# Patient Record
Sex: Female | Born: 1940 | Race: White | Hispanic: No | Smoking: Never smoker
Health system: Southern US, Community
[De-identification: ages and names within clinical notes are randomized; demographics above are authoritative.]

## PROBLEM LIST (undated history)

## (undated) DIAGNOSIS — J40 Bronchitis, not specified as acute or chronic: Secondary | ICD-10-CM

## (undated) DIAGNOSIS — E785 Hyperlipidemia, unspecified: Secondary | ICD-10-CM

## (undated) DIAGNOSIS — J189 Pneumonia, unspecified organism: Secondary | ICD-10-CM

## (undated) DIAGNOSIS — M810 Age-related osteoporosis without current pathological fracture: Secondary | ICD-10-CM

## (undated) DIAGNOSIS — N63 Unspecified lump in unspecified breast: Secondary | ICD-10-CM

## (undated) DIAGNOSIS — F410 Panic disorder [episodic paroxysmal anxiety] without agoraphobia: Secondary | ICD-10-CM

## (undated) DIAGNOSIS — I10 Essential (primary) hypertension: Secondary | ICD-10-CM

## (undated) DIAGNOSIS — T8859XA Other complications of anesthesia, initial encounter: Secondary | ICD-10-CM

## (undated) DIAGNOSIS — M1990 Unspecified osteoarthritis, unspecified site: Secondary | ICD-10-CM

## (undated) HISTORY — PX: TONSILLECTOMY: SUR1361

## (undated) HISTORY — PX: COLONOSCOPY: SHX174

## (undated) HISTORY — PX: TUBAL LIGATION: SHX77

---

## 2009-10-16 ENCOUNTER — Ambulatory Visit: Payer: Self-pay | Admitting: Family Medicine

## 2009-10-23 ENCOUNTER — Ambulatory Visit: Payer: Self-pay | Admitting: Family Medicine

## 2010-10-21 ENCOUNTER — Ambulatory Visit: Payer: Self-pay | Admitting: Family Medicine

## 2010-10-27 ENCOUNTER — Ambulatory Visit: Payer: Self-pay | Admitting: Family Medicine

## 2011-10-28 ENCOUNTER — Ambulatory Visit: Payer: Self-pay | Admitting: Family Medicine

## 2012-02-17 DIAGNOSIS — N63 Unspecified lump in unspecified breast: Secondary | ICD-10-CM

## 2012-02-17 HISTORY — DX: Unspecified lump in unspecified breast: N63.0

## 2012-04-29 ENCOUNTER — Emergency Department: Payer: Self-pay | Admitting: Unknown Physician Specialty

## 2012-04-29 LAB — COMPREHENSIVE METABOLIC PANEL
Albumin: 3.5 g/dL (ref 3.4–5.0)
Alkaline Phosphatase: 114 U/L (ref 50–136)
Anion Gap: 4 — ABNORMAL LOW (ref 7–16)
BUN: 19 mg/dL — ABNORMAL HIGH (ref 7–18)
Bilirubin,Total: 0.7 mg/dL (ref 0.2–1.0)
Calcium, Total: 8.8 mg/dL (ref 8.5–10.1)
Chloride: 105 mmol/L (ref 98–107)
Co2: 29 mmol/L (ref 21–32)
Creatinine: 1.03 mg/dL (ref 0.60–1.30)
EGFR (African American): >60
EGFR (Non-African Amer.): 55 — ABNORMAL LOW
Glucose: 95 mg/dL (ref 65–99)
Osmolality: 278 (ref 275–301)
Potassium: 4.3 mmol/L (ref 3.5–5.1)
SGOT(AST): 34 U/L (ref 15–37)
SGPT (ALT): 41 U/L (ref 12–78)
Sodium: 138 mmol/L (ref 136–145)
Total Protein: 7 g/dL (ref 6.4–8.2)

## 2012-04-29 LAB — CBC
HCT: 39.9 % (ref 35.0–47.0)
HGB: 13.4 g/dL (ref 12.0–16.0)
MCH: 29.5 pg (ref 26.0–34.0)
MCHC: 33.5 g/dL (ref 32.0–36.0)
MCV: 88 fL (ref 80–100)
Platelet: 253 10*3/uL (ref 150–440)
RBC: 4.53 10*6/uL (ref 3.80–5.20)
RDW: 14.3 % (ref 11.5–14.5)
WBC: 8 10*3/uL (ref 3.6–11.0)

## 2012-04-29 LAB — TROPONIN I: Troponin-I: <0.02 ng/mL

## 2012-04-29 LAB — CK TOTAL AND CKMB (NOT AT ARMC)
CK, Total: 77 U/L (ref 21–215)
CK-MB: 1.1 ng/mL (ref 0.5–3.6)

## 2012-10-28 ENCOUNTER — Ambulatory Visit: Payer: Self-pay | Admitting: Family Medicine

## 2012-11-16 ENCOUNTER — Ambulatory Visit: Payer: Self-pay | Admitting: Family Medicine

## 2012-12-23 ENCOUNTER — Ambulatory Visit: Payer: Self-pay | Admitting: Gastroenterology

## 2012-12-30 ENCOUNTER — Ambulatory Visit: Payer: Self-pay | Admitting: Gastroenterology

## 2014-01-24 DIAGNOSIS — R519 Headache, unspecified: Secondary | ICD-10-CM | POA: Insufficient documentation

## 2014-01-24 DIAGNOSIS — R4701 Aphasia: Secondary | ICD-10-CM | POA: Insufficient documentation

## 2014-01-24 DIAGNOSIS — G44319 Acute post-traumatic headache, not intractable: Secondary | ICD-10-CM | POA: Insufficient documentation

## 2014-01-24 DIAGNOSIS — R42 Dizziness and giddiness: Secondary | ICD-10-CM | POA: Insufficient documentation

## 2014-01-24 DIAGNOSIS — M792 Neuralgia and neuritis, unspecified: Secondary | ICD-10-CM | POA: Insufficient documentation

## 2014-12-05 ENCOUNTER — Other Ambulatory Visit: Payer: Self-pay | Admitting: Family Medicine

## 2014-12-05 DIAGNOSIS — N63 Unspecified lump in unspecified breast: Secondary | ICD-10-CM

## 2015-01-03 ENCOUNTER — Ambulatory Visit: Payer: Self-pay

## 2015-01-03 ENCOUNTER — Other Ambulatory Visit: Payer: Self-pay

## 2015-01-04 ENCOUNTER — Ambulatory Visit
Admission: RE | Admit: 2015-01-04 | Discharge: 2015-01-04 | Disposition: A | Discharge status: Home / Self Care | Payer: Medicare HMO | Source: Ambulatory Visit | Attending: Family Medicine | Admitting: Family Medicine

## 2015-01-04 ENCOUNTER — Encounter (INDEPENDENT_AMBULATORY_CARE_PROVIDER_SITE_OTHER): Payer: Self-pay

## 2015-01-04 DIAGNOSIS — N63 Unspecified lump in unspecified breast: Secondary | ICD-10-CM

## 2015-01-04 HISTORY — DX: Unspecified lump in unspecified breast: N63.0

## 2016-12-11 ENCOUNTER — Other Ambulatory Visit: Payer: Self-pay | Admitting: Family Medicine

## 2016-12-11 DIAGNOSIS — Z1231 Encounter for screening mammogram for malignant neoplasm of breast: Secondary | ICD-10-CM

## 2016-12-24 ENCOUNTER — Ambulatory Visit
Admission: RE | Admit: 2016-12-24 | Discharge: 2016-12-24 | Disposition: A | Discharge status: Home / Self Care | Payer: Medicare HMO | Source: Ambulatory Visit | Attending: Family Medicine | Admitting: Family Medicine

## 2016-12-24 DIAGNOSIS — Z1231 Encounter for screening mammogram for malignant neoplasm of breast: Secondary | ICD-10-CM | POA: Insufficient documentation

## 2017-12-21 ENCOUNTER — Other Ambulatory Visit: Payer: Self-pay | Admitting: Family Medicine

## 2017-12-21 DIAGNOSIS — Z1231 Encounter for screening mammogram for malignant neoplasm of breast: Secondary | ICD-10-CM

## 2018-01-27 ENCOUNTER — Ambulatory Visit
Admission: RE | Admit: 2018-01-27 | Discharge: 2018-01-27 | Disposition: A | Discharge status: Home / Self Care | Payer: Medicare HMO | Source: Ambulatory Visit | Attending: Family Medicine | Admitting: Family Medicine

## 2018-01-27 DIAGNOSIS — Z1231 Encounter for screening mammogram for malignant neoplasm of breast: Secondary | ICD-10-CM | POA: Insufficient documentation

## 2018-02-16 HISTORY — PX: CARPAL TUNNEL RELEASE: SHX101

## 2019-05-03 ENCOUNTER — Other Ambulatory Visit: Payer: Self-pay | Admitting: Family Medicine

## 2019-05-03 DIAGNOSIS — Z1231 Encounter for screening mammogram for malignant neoplasm of breast: Secondary | ICD-10-CM

## 2019-06-08 ENCOUNTER — Ambulatory Visit
Admission: RE | Admit: 2019-06-08 | Discharge: 2019-06-08 | Disposition: A | Discharge status: Home / Self Care | Payer: Medicare HMO | Source: Ambulatory Visit | Attending: Family Medicine | Admitting: Family Medicine

## 2019-06-08 DIAGNOSIS — Z1231 Encounter for screening mammogram for malignant neoplasm of breast: Secondary | ICD-10-CM | POA: Diagnosis present

## 2020-08-09 ENCOUNTER — Other Ambulatory Visit: Payer: Self-pay | Admitting: Orthopedic Surgery

## 2020-08-09 ENCOUNTER — Other Ambulatory Visit (HOSPITAL_COMMUNITY): Payer: Self-pay | Admitting: Orthopedic Surgery

## 2020-08-09 DIAGNOSIS — M5416 Radiculopathy, lumbar region: Secondary | ICD-10-CM

## 2020-08-18 ENCOUNTER — Ambulatory Visit
Admission: RE | Admit: 2020-08-18 | Discharge: 2020-08-18 | Disposition: A | Discharge status: Home / Self Care | Payer: Medicare HMO | Source: Ambulatory Visit | Attending: Orthopedic Surgery | Admitting: Orthopedic Surgery

## 2020-08-18 ENCOUNTER — Other Ambulatory Visit: Payer: Self-pay

## 2020-08-18 DIAGNOSIS — M5416 Radiculopathy, lumbar region: Secondary | ICD-10-CM | POA: Insufficient documentation

## 2020-11-23 NOTE — Discharge Instructions (Signed)
Instructions after Total Hip Replacement     Ronney Honeywell P. Mellany Dinsmore, Jr., M.D.     Dept. of Orthopaedics & Sports Medicine  [facility]  [address]  [place], [place]  [address]  Phone: [phone]   Fax: [phone]    DIET: . Drink plenty of non-alcoholic fluids. . Resume your normal diet. Include foods high in fiber.  ACTIVITY:  . You may use crutches or a walker with weight-bearing as tolerated, unless instructed otherwise. . You may be weaned off of the walker or crutches by your Physical Therapist.  . Do NOT reach below the level of your knees or cross your legs until allowed.    . Continue doing gentle exercises. Exercising will reduce the pain and swelling, increase motion, and prevent muscle weakness.   . Please continue to use the TED compression stockings for 6 weeks. You may remove the stockings at night, but should reapply them in the morning. . Do not drive or operate any equipment until instructed.  WOUND CARE:  . Continue to use ice packs periodically to reduce pain and swelling. . Keep the incision clean and dry. . You may bathe or shower after the staples are removed at the first office visit following surgery.  MEDICATIONS: . You may resume your regular medications. . Please take the pain medication as prescribed on the medication. . Do not take pain medication on an empty stomach. . You have been given a prescription for a blood thinner to prevent blood clots. Please take the medication as instructed. (NOTE: After completing a 2 week course of Lovenox, take one Enteric-coated aspirin once a day.) . Pain medications and iron supplements can cause constipation. Use a stool softener (Senokot or Colace) on a daily basis and a laxative (dulcolax or miralax) as needed. . Do not drive or drink alcoholic beverages when taking pain medications.  CALL THE OFFICE FOR: . Temperature above 101 degrees . Excessive bleeding or drainage on the dressing. . Excessive  swelling, coldness, or paleness of the toes. . Persistent nausea and vomiting.  FOLLOW-UP:  . You should have an appointment to return to the office in 6 weeks after surgery. . Arrangements have been made for continuation of Physical Therapy (either home therapy or outpatient therapy).     [facility] Department Directory         [email or web address]       [email or web address]          Cardiology  Appointments: [place] - [phone] [place] - [phone]  Endocrinology  Appointments: [place] - [phone] [place] - [phone]  Gastroenterology  Appointments: [place] - [phone] [place] - [phone]        General Surgery   Appointments: [place] - [phone]  Internal Medicine/Family Medicine  Appointments: [place] - [phone] [place] - [phone] [place] - [phone]  Metabolic and Weigh Loss Surgery  Appointments: [place] - [phone]        Neurology  Appointments: [place] - [phone] [place] - [phone]  Neurosurgery  Appointments: [place] - [phone]  Obstetrics & Gynecology  Appointments: [place] - [phone] [place] - [phone]        Pediatrics  Appointments: [place] - [phone] [place] - [phone]  Physiatry  Appointments: [place] -[phone]  Physical Therapy  Appointments: [place] - [phone] [place] - [phone]        Podiatry  Appointments: [place] - [phone] [place] - [phone]  Pulmonology  Appointments: [place] - [phone]  Rheumatology  Appointments: [place] - [phone]         Location: [facility]  [address] [place], [place]  [address]  [place] Location: [facility] [address] [place], [place]  [address]  [place] Location: [facility] [address] [place], [place]  [address]    

## 2020-12-02 ENCOUNTER — Other Ambulatory Visit
Admission: RE | Admit: 2020-12-02 | Discharge: 2020-12-02 | Disposition: A | Discharge status: Home / Self Care | Payer: Medicare HMO | Source: Ambulatory Visit | Attending: Orthopedic Surgery | Admitting: Orthopedic Surgery

## 2020-12-02 ENCOUNTER — Other Ambulatory Visit: Payer: Self-pay

## 2020-12-02 DIAGNOSIS — M1611 Unilateral primary osteoarthritis, right hip: Secondary | ICD-10-CM | POA: Insufficient documentation

## 2020-12-02 DIAGNOSIS — Z01818 Encounter for other preprocedural examination: Secondary | ICD-10-CM | POA: Diagnosis not present

## 2020-12-02 DIAGNOSIS — I10 Essential (primary) hypertension: Secondary | ICD-10-CM | POA: Insufficient documentation

## 2020-12-02 HISTORY — DX: Pneumonia, unspecified organism: J18.9

## 2020-12-02 HISTORY — DX: Essential (primary) hypertension: I10

## 2020-12-02 HISTORY — DX: Hyperlipidemia, unspecified: E78.5

## 2020-12-02 HISTORY — DX: Other complications of anesthesia, initial encounter: T88.59XA

## 2020-12-02 HISTORY — DX: Panic disorder (episodic paroxysmal anxiety): F41.0

## 2020-12-02 HISTORY — DX: Age-related osteoporosis without current pathological fracture: M81.0

## 2020-12-02 HISTORY — DX: Bronchitis, not specified as acute or chronic: J40

## 2020-12-02 HISTORY — DX: Unspecified osteoarthritis, unspecified site: M19.90

## 2020-12-02 LAB — COMPREHENSIVE METABOLIC PANEL
ALT: 29 U/L (ref 0–44)
AST: 28 U/L (ref 15–41)
Albumin: 3.8 g/dL (ref 3.5–5.0)
Alkaline Phosphatase: 106 U/L (ref 38–126)
Anion gap: 10 (ref 5–15)
BUN: 20 mg/dL (ref 8–23)
CO2: 31 mmol/L (ref 22–32)
Calcium: 9.6 mg/dL (ref 8.9–10.3)
Chloride: 102 mmol/L (ref 98–111)
Creatinine, Ser: 1.01 mg/dL — ABNORMAL HIGH (ref 0.44–1.00)
GFR, Estimated: 56 mL/min — ABNORMAL LOW (ref >60)
Glucose, Bld: 117 mg/dL — ABNORMAL HIGH (ref 70–99)
Potassium: 4.2 mmol/L (ref 3.5–5.1)
Sodium: 143 mmol/L (ref 135–145)
Total Bilirubin: 0.8 mg/dL (ref 0.3–1.2)
Total Protein: 7 g/dL (ref 6.5–8.1)

## 2020-12-02 LAB — PROTIME-INR
INR: 1 (ref 0.8–1.2)
Prothrombin Time: 13.1 seconds (ref 11.4–15.2)

## 2020-12-02 LAB — URINALYSIS, ROUTINE W REFLEX MICROSCOPIC
Bilirubin Urine: NEGATIVE
Glucose, UA: NEGATIVE mg/dL
Hgb urine dipstick: NEGATIVE
Ketones, ur: NEGATIVE mg/dL
Nitrite: NEGATIVE
Protein, ur: 30 mg/dL — AB
Specific Gravity, Urine: 1.023 (ref 1.005–1.030)
pH: 5 (ref 5.0–8.0)

## 2020-12-02 LAB — TYPE AND SCREEN
ABO/RH(D): B POS
Antibody Screen: NEGATIVE

## 2020-12-02 LAB — CBC
HCT: 38.5 % (ref 36.0–46.0)
Hemoglobin: 12.7 g/dL (ref 12.0–15.0)
MCH: 30.6 pg (ref 26.0–34.0)
MCHC: 33 g/dL (ref 30.0–36.0)
MCV: 92.8 fL (ref 80.0–100.0)
Platelets: 299 10*3/uL (ref 150–400)
RBC: 4.15 MIL/uL (ref 3.87–5.11)
RDW: 13.5 % (ref 11.5–15.5)
WBC: 5.5 10*3/uL (ref 4.0–10.5)
nRBC: 0 % (ref 0.0–0.2)

## 2020-12-02 LAB — SURGICAL PCR SCREEN
MRSA, PCR: NEGATIVE
Staphylococcus aureus: POSITIVE — AB

## 2020-12-02 LAB — C-REACTIVE PROTEIN: CRP: 0.9 mg/dL (ref <1.0)

## 2020-12-02 LAB — APTT: aPTT: 27 seconds (ref 24–36)

## 2020-12-02 LAB — SEDIMENTATION RATE: Sed Rate: 34 mm/hr — ABNORMAL HIGH (ref 0–30)

## 2020-12-02 NOTE — Patient Instructions (Addendum)
Your procedure is scheduled on:12-09-20 Monday Report to the Registration Desk on the 1st floor of the Medical Mall.Then proceed to the 2nd floor Surgery Desk in the Medical Mall To find out your arrival time, please call [phone] between 1PM - 3PM on:12-06-20 Friday  REMEMBER: Instructions that are not followed completely may result in serious medical risk, up to and including death; or upon the discretion of your surgeon and anesthesiologist your surgery may need to be rescheduled.  Do not eat food after midnight the night before surgery.  No gum chewing, lozengers or hard candies.  You may however, drink CLEAR liquids up to 2 hours before you are scheduled to arrive for your surgery. Do not drink anything within 2 hours of your scheduled arrival time.  Clear liquids include: - water  - apple juice without pulp - gatorade (not RED, PURPLE, OR BLUE) - black coffee or tea (Do NOT add milk or creamers to the coffee or tea) Do NOT drink anything that is not on this list.  In addition, your doctor has ordered for you to drink the provided  Ensure Pre-Surgery Clear Carbohydrate Drink  Drinking this carbohydrate drink up to two hours before surgery helps to reduce insulin resistance and improve patient outcomes. Please complete drinking 2 hours prior to scheduled arrival time.  TAKE THESE MEDICATIONS THE MORNING OF SURGERY WITH A SIP OF WATER: -amLODipine (NORVASC) 5 MG tablet  Stop your 81 mg Aspirin NOW (12-02-20)   One week prior to surgery: Stop Anti-inflammatories (NSAIDS) such as meloxicam (MOBIC) 15 MG tablet, Advil, Aleve, Ibuprofen, Motrin, Naproxen, Naprosyn and Aspirin based products such as Excedrin, Goodys Powder, BC Powder.You may however, continue to take Tylenol if needed for pain up until the day of surgery.  Stop ANY OVER THE COUNTER supplements/vitamins NOW (12-02-20) until after surgery (Turmeric)  No Alcohol for 24 hours before or after surgery.  No Smoking  including e-cigarettes for 24 hours prior to surgery.  No chewable tobacco products for at least 6 hours prior to surgery.  No nicotine patches on the day of surgery.  Do not use any "recreational" drugs for at least a week prior to your surgery.  Please be advised that the combination of cocaine and anesthesia may have negative outcomes, up to and including death. If you test positive for cocaine, your surgery will be cancelled.  On the morning of surgery brush your teeth with toothpaste and water, you may rinse your mouth with mouthwash if you wish. Do not swallow any toothpaste or mouthwash.  Use CHG Soap as directed on instruction sheet.  Do not wear jewelry, make-up, hairpins, clips or nail polish.  Do not wear lotions, powders, or perfumes.   Do not shave body from the neck down 48 hours prior to surgery just in case you cut yourself which could leave a site for infection.  Also, freshly shaved skin may become irritated if using the CHG soap.  Contact lenses, hearing aids and dentures may not be worn into surgery.  Do not bring valuables to the hospital. [facility] is not responsible for any missing/lost belongings or valuables.   Notify your doctor if there is any change in your medical condition (cold, fever, infection).  Wear comfortable clothing (specific to your surgery type) to the hospital.  After surgery, you can help prevent lung complications by doing breathing exercises.  Take deep breaths and cough every 1-2 hours. Your doctor may order a device called an Incentive Spirometer to help  you take deep breaths. When coughing or sneezing, hold a pillow firmly against your incision with both hands. This is called "splinting." Doing this helps protect your incision. It also decreases belly discomfort.  If you are being admitted to the hospital overnight, leave your suitcase in the car. After surgery it may be brought to your room.  If you are being discharged the day of  surgery, you will not be allowed to drive home. You will need a responsible adult (18 years or older) to drive you home and stay with you that night.   If you are taking public transportation, you will need to have a responsible adult (18 years or older) with you. Please confirm with your physician that it is acceptable to use public transportation.   Please call the Pre-admissions Testing Dept. at [phone] if you have any questions about these instructions.  Surgery Visitation Policy:  Patients undergoing a surgery or procedure may have one family member or support person with them as long as that person is not COVID-19 positive or experiencing its symptoms.  That person may remain in the waiting area during the procedure and may rotate out with other people.  Inpatient Visitation:    Visiting hours are 7 a.m. to 8 p.m. Up to two visitors ages 16+ are allowed at one time in a patient room. The visitors may rotate out with other people during the day. Visitors must check out when they leave, or other visitors will not be allowed. One designated support person may remain overnight. The visitor must pass COVID-19 screenings, use hand sanitizer when entering and exiting the patient's room and wear a mask at all times, including in the patient's room. Patients must also wear a mask when staff or their visitor are in the room. Masking is required regardless of vaccination status.

## 2020-12-03 LAB — URINE CULTURE

## 2020-12-05 ENCOUNTER — Other Ambulatory Visit
Admission: RE | Admit: 2020-12-05 | Discharge: 2020-12-05 | Disposition: A | Discharge status: Home / Self Care | Payer: Medicare HMO | Source: Ambulatory Visit | Attending: Orthopedic Surgery | Admitting: Orthopedic Surgery

## 2020-12-05 ENCOUNTER — Other Ambulatory Visit: Payer: Self-pay

## 2020-12-05 DIAGNOSIS — Z01812 Encounter for preprocedural laboratory examination: Secondary | ICD-10-CM | POA: Diagnosis present

## 2020-12-05 DIAGNOSIS — Z20822 Contact with and (suspected) exposure to covid-19: Secondary | ICD-10-CM | POA: Insufficient documentation

## 2020-12-05 LAB — SARS CORONAVIRUS 2 (TAT 6-24 HRS): SARS Coronavirus 2: NEGATIVE

## 2020-12-07 NOTE — Anesthesia Preprocedure Evaluation (Addendum)
Anesthesia Evaluation  Patient identified by MRN, date of birth, ID band Patient awake    Reviewed: Allergy & Precautions, NPO status , Patient's Chart, lab work & pertinent test results  Airway Mallampati: III  TM Distance: >3 FB Neck ROM: full    Dental  (+) Edentulous Upper, Edentulous Lower   Pulmonary shortness of breath and with exertion,    Pulmonary exam normal        Cardiovascular Exercise Tolerance: Poor METS: 3 - Mets hypertension, Pt. on medications Normal cardiovascular exam     Neuro/Psych PSYCHIATRIC DISORDERS Anxiety negative neurological ROS     GI/Hepatic negative GI ROS, Neg liver ROS,   Endo/Other  negative endocrine ROS  Renal/GU      Musculoskeletal  (+) Arthritis ,   Abdominal (+) + obese,   Peds  Hematology negative hematology ROS (+)   Anesthesia Other Findings Past Medical History: No date: Arthritis 2014: Breast mass     Comment:  report requested 6 mo f/u cysts rt, pt did not return No date: Bronchitis     Comment:  chronic No date: Complication of anesthesia     Comment:  hard to wake up No date: Hyperlipidemia No date: Hypertension No date: Osteoporosis No date: Panic attacks No date: Pneumonia     Comment:  h/o  Past Surgical History: 2020: CARPAL TUNNEL RELEASE; Right No date: COLONOSCOPY No date: TONSILLECTOMY No date: TUBAL LIGATION     Reproductive/Obstetrics negative OB ROS                            Anesthesia Physical Anesthesia Plan  ASA: 2  Anesthesia Plan: Spinal   Post-op Pain Management:    Induction:   PONV Risk Score and Plan:   Airway Management Planned: Natural Airway and Simple Face Mask  Additional Equipment:   Intra-op Plan:   Post-operative Plan:   Informed Consent: I have reviewed the patients History and Physical, chart, labs and discussed the procedure including the risks, benefits and alternatives for  the proposed anesthesia with the patient or authorized representative who has indicated his/her understanding and acceptance.     Dental advisory given  Plan Discussed with: CRNA, Anesthesiologist and Surgeon  Anesthesia Plan Comments: (Discussed back-up General anesthesia)       Anesthesia Quick Evaluation

## 2020-12-08 NOTE — H&P (Signed)
ORTHOPAEDIC HISTORY & PHYSICAL [name], [name] - 12/03/2020 1:15 PM EDT Formatting of this note is different from the original. [facility] Chief Complaint:   Chief Complaint  Patient presents with   Hip Pain  H & P RIGHT HIP   History of Present Illness:   Catherine Hale is a 80 y.o. female that presents to clinic today for her preoperative history and evaluation. Patient presents unaccompanied. The patient is scheduled to undergo a right total knee arthroplasty on 12/09/20 by Dr. Ernest Pine. Her pain began over 1 year ago. The pain is located primarily in the right hip and thigh. She describes her pain as worse with weightbearing. She reports associated getting in and out of the car, putting on her shoes. She denies associated numbness or tingling.   The patient's symptoms have progressed to the point that they decrease her quality of life. The patient has previously undergone conservative treatment including NSAIDS and injections to the knee without adequate control of her symptoms.  Denies significant cardiac history, denies history of lumbar surgery, or history of blood clots. Her daughter will be staying with her after surgery.   Patient does ask about preparing forms for her to have a handicap sticker.   Past Medical, Surgical, Family, Social History, Allergies, Medications:   Past Medical History:  Past Medical History:  Diagnosis Date   Bursitis   Chickenpox   Depression   Hyperlipidemia   Hypertension   Osteoporosis, post-menopausal  but doesn't remember her last bone density, she has actually been on medications for this in the past also.   Panic attacks  has been on Paxil in the past.   Pneumonia  back in the 70's   Past Surgical History:  Past Surgical History:  Procedure Laterality Date   COLONOSCOPY 12/2012   Right carpal tunnel release Right 03/01/2019  Dr. Rosita Kea   TONSILLECTOMY 1970   TUBAL LIGATION  Bilateral 1976   Current Medications:  Current Outpatient Medications  Medication Sig Dispense Refill   acetaminophen (TYLENOL) 500 MG tablet Take 500 mg by mouth as needed for Pain   amLODIPine (NORVASC) 5 MG tablet TAKE 1 TABLET(5 MG) BY MOUTH EVERY DAY 90 tablet 3   aspirin 81 MG EC tablet Take 81 mg by mouth once daily.   fluticasone propionate (FLONASE) 50 mcg/actuation nasal spray SHAKE LIQUID AND USE 2 SPRAYS IN EACH NOSTRIL EVERY DAY 48 g 3   meloxicam (MOBIC) 15 MG tablet Take 1 tablet (15 mg total) by mouth once daily 90 tablet 3   TURMERIC ORAL Take 1 capsule by mouth once daily   benzonatate (TESSALON) 200 MG capsule Take 1 capsule (200 mg total) by mouth every 8 (eight) hours as needed for Cough 30 capsule 0   green tea leaf extract Cap Take 1 capsule by mouth once daily   No current facility-administered medications for this visit.   Allergies:  Allergies  Allergen Reactions   Lactose Diarrhea   Lisinopril Cough   Social History:  Social History   Socioeconomic History   Marital status: Widowed   Number of children: 2   Years of education: 15  Occupational History   Occupation: Part-time- Audiological scientist  Tobacco Use   Smoking status: Never Smoker   Smokeless tobacco: Never Used  Building services engineer Use: Never used  Substance and Sexual Activity   Alcohol use: No  Alcohol/week: 0.0 standard drinks   Drug use: No   Sexual  activity: Defer  Partners: Male   Family History:  Family History  Problem Relation Age of Onset   Colon cancer Mother   Myocardial Infarction (Heart attack) Sister 68   Review of Systems:   A 10+ ROS was performed, reviewed, and the pertinent orthopaedic findings are documented in the HPI.   Physical Examination:   BP 126/80 (BP Location: Left upper arm, Patient Position: Sitting, BP Cuff Size: Large Adult)  Ht 162.6 cm (5\' 4" )  Wt 90.2 kg (198 lb 12.8 oz)  LMP (LMP Unknown)  BMI 34.12 kg/m   Patient is a well-developed,  well-nourished female in no acute distress. Patient has normal mood and affect. Patient is alert and oriented to person, place, and time.   HEENT: Atraumatic, normocephalic. Pupils equal and reactive to light. Extraocular motion intact. Noninjected sclera.  Cardiovascular: Regular rate and rhythm, with no murmurs, rubs, or gallops. Distal pulses palpable. No carotid bruits.  Respiratory: Lungs clear to auscultation bilaterally.   Right Hip: Pelvic tilt: Negative Limb lengths: Equal with the patient standing Soft tissue swelling: Negative Erythema: Negative Crepitance: Negative Tenderness: Greater trochanter is nontender to palpation. Moderate pain is elicited by axial compression or extremes of rotation. Atrophy: No atrophy. Fair to good hip flexor and abductor strength. Range of Motion: EXT/FLEX: 0/0/90 ADD/ABD: 10/0/10 IR/ER: 10/0/10  Sensation intact over the saphenous, lateral sural cutaneous, superficial fibular, and deep fibular nerve distributions.  Tests Performed/Reviewed:  X-rays  Anteroposterior view of the pelvis as well as anteroposterior and lateral views of the right hip were obtained. Images reveal significant loss of femoral acetabular joint space with osteophyte formation. No fractures or dislocations.  Personally ordered and interpreted today's x-rays.  Impression:   ICD-10-CM  1. Primary osteoarthritis of right hip M16.11   Plan:   The patient has end-stage degenerative changes of the right knee. It was explained to the patient that the condition is progressive in nature. Having failed conservative treatment, the patient has elected to proceed with a total joint arthroplasty. The patient will undergo a total joint arthroplasty with Dr. 12-04-2004. The risks of surgery, including blood clot and infection, were discussed with the patient. Measures to reduce these risks, including the use of anticoagulation, perioperative antibiotics, and early ambulation were  discussed. The importance of postoperative physical therapy was discussed with the patient. The patient elects to proceed with surgery. The patient is instructed to stop all blood thinners prior to surgery. The patient is instructed to call the hospital the day before surgery to learn of the proper arrival time.   Contact our office with any questions or concerns. Follow up as indicated, or sooner should any new problems arise, if conditions worsen, or if they are otherwise concerned.   Ernest Pine, PA -C [facility]opa[facility] and [facility] Medicine [address] [place], [place] Phone: [phone]  This note was generated in part with voice recognition software and I apologize for any typographical errors that were not detected and corrected.  Electronically signed by [phone], PA at 12/06/2020 7:47 PM EDT

## 2020-12-09 ENCOUNTER — Encounter
Admission: RE | Disposition: A | Discharge status: Home / Self Care | Payer: Self-pay | Source: Home / Self Care | Attending: Orthopedic Surgery

## 2020-12-09 ENCOUNTER — Ambulatory Visit: Payer: Medicare HMO | Admitting: Anesthesiology

## 2020-12-09 ENCOUNTER — Encounter: Payer: Self-pay | Admitting: Orthopedic Surgery

## 2020-12-09 ENCOUNTER — Observation Stay
Admission: RE | Admit: 2020-12-09 | Discharge: 2020-12-10 | Disposition: A | Discharge status: Home / Self Care | Payer: Medicare HMO | Attending: Orthopedic Surgery | Admitting: Orthopedic Surgery

## 2020-12-09 ENCOUNTER — Ambulatory Visit: Payer: Medicare HMO | Admitting: Urgent Care

## 2020-12-09 ENCOUNTER — Other Ambulatory Visit: Payer: Self-pay

## 2020-12-09 ENCOUNTER — Observation Stay: Payer: Medicare HMO

## 2020-12-09 DIAGNOSIS — M1611 Unilateral primary osteoarthritis, right hip: Secondary | ICD-10-CM | POA: Diagnosis not present

## 2020-12-09 DIAGNOSIS — F32A Depression, unspecified: Secondary | ICD-10-CM | POA: Insufficient documentation

## 2020-12-09 DIAGNOSIS — Z79899 Other long term (current) drug therapy: Secondary | ICD-10-CM | POA: Diagnosis not present

## 2020-12-09 DIAGNOSIS — Z96641 Presence of right artificial hip joint: Secondary | ICD-10-CM

## 2020-12-09 DIAGNOSIS — I10 Essential (primary) hypertension: Secondary | ICD-10-CM | POA: Diagnosis not present

## 2020-12-09 DIAGNOSIS — M810 Age-related osteoporosis without current pathological fracture: Secondary | ICD-10-CM | POA: Insufficient documentation

## 2020-12-09 DIAGNOSIS — Z7982 Long term (current) use of aspirin: Secondary | ICD-10-CM | POA: Diagnosis not present

## 2020-12-09 DIAGNOSIS — J189 Pneumonia, unspecified organism: Secondary | ICD-10-CM | POA: Insufficient documentation

## 2020-12-09 DIAGNOSIS — F410 Panic disorder [episodic paroxysmal anxiety] without agoraphobia: Secondary | ICD-10-CM | POA: Insufficient documentation

## 2020-12-09 DIAGNOSIS — E785 Hyperlipidemia, unspecified: Secondary | ICD-10-CM | POA: Insufficient documentation

## 2020-12-09 DIAGNOSIS — Z96649 Presence of unspecified artificial hip joint: Secondary | ICD-10-CM

## 2020-12-09 DIAGNOSIS — M719 Bursopathy, unspecified: Secondary | ICD-10-CM | POA: Insufficient documentation

## 2020-12-09 HISTORY — PX: TOTAL HIP ARTHROPLASTY: SHX124

## 2020-12-09 LAB — ABO/RH: ABO/RH(D): B POS

## 2020-12-09 SURGERY — ARTHROPLASTY, HIP, TOTAL,POSTERIOR APPROACH
Anesthesia: Spinal | Site: Hip | Laterality: Right

## 2020-12-09 MED ORDER — FLEET ENEMA 7-19 GM/118ML RE ENEM
1.0000 | ENEMA | Freq: Once | RECTAL | Status: DC | PRN
Start: 2020-12-09 — End: 2020-12-10

## 2020-12-09 MED ORDER — CELECOXIB 200 MG PO CAPS
ORAL_CAPSULE | ORAL | Status: AC
  Administered 2020-12-09: 400 mg via ORAL
  Filled 2020-12-09: qty 2

## 2020-12-09 MED ORDER — ONDANSETRON HCL 4 MG PO TABS
4.0000 mg | ORAL_TABLET | Freq: Four times a day (QID) | ORAL | Status: DC | PRN
  Administered 2020-12-10: 4 mg via ORAL
  Filled 2020-12-09: qty 1

## 2020-12-09 MED ORDER — METAXALONE 800 MG PO TABS
800.0000 mg | ORAL_TABLET | Freq: Once | ORAL | Status: DC | PRN
  Filled 2020-12-09: qty 1

## 2020-12-09 MED ORDER — FAMOTIDINE 20 MG PO TABS
ORAL_TABLET | ORAL | Status: AC
  Administered 2020-12-09: 20 mg via ORAL
  Filled 2020-12-09: qty 1

## 2020-12-09 MED ORDER — DEXAMETHASONE SODIUM PHOSPHATE 10 MG/ML IJ SOLN
INTRAMUSCULAR | Status: AC
  Administered 2020-12-09: 8 mg via INTRAVENOUS
  Filled 2020-12-09: qty 1

## 2020-12-09 MED ORDER — AMLODIPINE BESYLATE 5 MG PO TABS
5.0000 mg | ORAL_TABLET | ORAL | Status: DC
  Administered 2020-12-10: 5 mg via ORAL
  Filled 2020-12-09: qty 1

## 2020-12-09 MED ORDER — PROPOFOL 10 MG/ML IV BOLUS
INTRAVENOUS | Status: DC | PRN
  Administered 2020-12-09: 20 mg via INTRAVENOUS

## 2020-12-09 MED ORDER — GABAPENTIN 300 MG PO CAPS: 300.0000 mg | ORAL_CAPSULE | Freq: Once | ORAL | Status: AC

## 2020-12-09 MED ORDER — CELECOXIB 200 MG PO CAPS: 400.0000 mg | ORAL_CAPSULE | Freq: Once | ORAL | Status: AC

## 2020-12-09 MED ORDER — TRANEXAMIC ACID-NACL 1000-0.7 MG/100ML-% IV SOLN: 1000.0000 mg | Freq: Once | INTRAVENOUS | Status: AC

## 2020-12-09 MED ORDER — TRANEXAMIC ACID-NACL 1000-0.7 MG/100ML-% IV SOLN
1000.0000 mg | INTRAVENOUS | Status: AC
  Administered 2020-12-09: 1000 mg via INTRAVENOUS

## 2020-12-09 MED ORDER — GABAPENTIN 300 MG PO CAPS
ORAL_CAPSULE | ORAL | Status: AC
  Administered 2020-12-09: 300 mg via ORAL
  Filled 2020-12-09: qty 1

## 2020-12-09 MED ORDER — OXYCODONE HCL 5 MG PO TABS
5.0000 mg | ORAL_TABLET | Freq: Once | ORAL | Status: DC | PRN
Start: 2020-12-09 — End: 2020-12-09

## 2020-12-09 MED ORDER — SODIUM CHLORIDE 0.9 % IV SOLN: INTRAVENOUS | Status: DC

## 2020-12-09 MED ORDER — PHENYLEPHRINE HCL-NACL 20-0.9 MG/250ML-% IV SOLN
INTRAVENOUS | Status: DC | PRN
  Administered 2020-12-09: 25 ug/min via INTRAVENOUS

## 2020-12-09 MED ORDER — ACETAMINOPHEN 10 MG/ML IV SOLN
INTRAVENOUS | Status: DC | PRN
  Administered 2020-12-09: 1000 mg via INTRAVENOUS

## 2020-12-09 MED ORDER — PHENOL 1.4 % MT LIQD
1.0000 | OROMUCOSAL | Status: DC | PRN
  Filled 2020-12-09: qty 177

## 2020-12-09 MED ORDER — CHLORHEXIDINE GLUCONATE 4 % EX LIQD: 60.0000 mL | Freq: Once | CUTANEOUS | Status: DC

## 2020-12-09 MED ORDER — FAMOTIDINE 20 MG PO TABS: 20.0000 mg | ORAL_TABLET | Freq: Once | ORAL | Status: AC

## 2020-12-09 MED ORDER — CEFAZOLIN SODIUM-DEXTROSE 2-4 GM/100ML-% IV SOLN
2.0000 g | Freq: Four times a day (QID) | INTRAVENOUS | Status: AC
  Administered 2020-12-09: 2 g via INTRAVENOUS
  Filled 2020-12-09 (×2): qty 100

## 2020-12-09 MED ORDER — FERROUS SULFATE 325 (65 FE) MG PO TABS
325.0000 mg | ORAL_TABLET | Freq: Two times a day (BID) | ORAL | Status: DC
  Administered 2020-12-09 – 2020-12-10 (×3): 325 mg via ORAL
  Filled 2020-12-09 (×3): qty 1

## 2020-12-09 MED ORDER — CHLORHEXIDINE GLUCONATE 0.12 % MT SOLN
OROMUCOSAL | Status: AC
  Administered 2020-12-09: 15 mL via OROMUCOSAL
  Filled 2020-12-09: qty 15

## 2020-12-09 MED ORDER — SURGIRINSE WOUND IRRIGATION SYSTEM - OPTIME
TOPICAL | Status: DC | PRN
  Administered 2020-12-09: 450 mL via TOPICAL

## 2020-12-09 MED ORDER — BISACODYL 10 MG RE SUPP
10.0000 mg | Freq: Every day | RECTAL | Status: DC | PRN
  Filled 2020-12-09: qty 1

## 2020-12-09 MED ORDER — DEXAMETHASONE SODIUM PHOSPHATE 10 MG/ML IJ SOLN: 8.0000 mg | Freq: Once | INTRAMUSCULAR | Status: AC

## 2020-12-09 MED ORDER — ACETAMINOPHEN 10 MG/ML IV SOLN: 1000.0000 mg | Freq: Once | INTRAVENOUS | Status: DC | PRN

## 2020-12-09 MED ORDER — ONDANSETRON HCL 4 MG/2ML IJ SOLN
4.0000 mg | Freq: Four times a day (QID) | INTRAMUSCULAR | Status: DC | PRN
  Administered 2020-12-09: 4 mg via INTRAVENOUS
  Filled 2020-12-09: qty 2

## 2020-12-09 MED ORDER — CEFAZOLIN SODIUM-DEXTROSE 2-4 GM/100ML-% IV SOLN
2.0000 g | INTRAVENOUS | Status: AC
  Administered 2020-12-09: 2 g via INTRAVENOUS

## 2020-12-09 MED ORDER — MIDAZOLAM HCL 5 MG/5ML IJ SOLN
INTRAMUSCULAR | Status: DC | PRN
  Administered 2020-12-09: .5 mg via INTRAVENOUS
  Administered 2020-12-09: 1 mg via INTRAVENOUS
  Administered 2020-12-09: .5 mg via INTRAVENOUS

## 2020-12-09 MED ORDER — FENTANYL CITRATE (PF) 100 MCG/2ML IJ SOLN: 25.0000 ug | INTRAMUSCULAR | Status: DC | PRN

## 2020-12-09 MED ORDER — PROPOFOL 500 MG/50ML IV EMUL
INTRAVENOUS | Status: DC | PRN
  Administered 2020-12-09: 60 ug/kg/min via INTRAVENOUS

## 2020-12-09 MED ORDER — PHENYLEPHRINE HCL (PRESSORS) 10 MG/ML IV SOLN
INTRAVENOUS | Status: AC
  Filled 2020-12-09: qty 1

## 2020-12-09 MED ORDER — NEOMYCIN-POLYMYXIN B GU 40-200000 IR SOLN
Status: DC | PRN
  Administered 2020-12-09: 12 mL

## 2020-12-09 MED ORDER — SODIUM CHLORIDE 0.9 % IR SOLN
Status: DC | PRN
  Administered 2020-12-09: 3000 mL

## 2020-12-09 MED ORDER — BUPIVACAINE HCL (PF) 0.5 % IJ SOLN
INTRAMUSCULAR | Status: DC | PRN
  Administered 2020-12-09: 3 mL

## 2020-12-09 MED ORDER — OXYCODONE HCL 5 MG/5ML PO SOLN: 5.0000 mg | Freq: Once | ORAL | Status: DC | PRN

## 2020-12-09 MED ORDER — MAGNESIUM HYDROXIDE 400 MG/5ML PO SUSP
30.0000 mL | Freq: Every day | ORAL | Status: DC
  Administered 2020-12-09 – 2020-12-10 (×2): 30 mL via ORAL
  Filled 2020-12-09 (×2): qty 30

## 2020-12-09 MED ORDER — TRAMADOL HCL 50 MG PO TABS
50.0000 mg | ORAL_TABLET | ORAL | Status: DC | PRN
  Administered 2020-12-09 – 2020-12-10 (×4): 100 mg via ORAL
  Filled 2020-12-09 (×4): qty 2

## 2020-12-09 MED ORDER — SENNOSIDES-DOCUSATE SODIUM 8.6-50 MG PO TABS
1.0000 | ORAL_TABLET | Freq: Two times a day (BID) | ORAL | Status: DC
  Administered 2020-12-09 – 2020-12-10 (×2): 1 via ORAL
  Filled 2020-12-09 (×2): qty 1

## 2020-12-09 MED ORDER — MENTHOL 3 MG MT LOZG
1.0000 | LOZENGE | OROMUCOSAL | Status: DC | PRN
  Filled 2020-12-09: qty 9

## 2020-12-09 MED ORDER — ACETAMINOPHEN 10 MG/ML IV SOLN
INTRAVENOUS | Status: AC
  Filled 2020-12-09: qty 100

## 2020-12-09 MED ORDER — TRANEXAMIC ACID-NACL 1000-0.7 MG/100ML-% IV SOLN
INTRAVENOUS | Status: AC
  Administered 2020-12-09: 1000 mg via INTRAVENOUS
  Filled 2020-12-09: qty 100

## 2020-12-09 MED ORDER — ENSURE PRE-SURGERY PO LIQD
296.0000 mL | Freq: Once | ORAL | Status: AC
  Administered 2020-12-09: 296 mL via ORAL
  Filled 2020-12-09: qty 296

## 2020-12-09 MED ORDER — CEFAZOLIN SODIUM-DEXTROSE 2-4 GM/100ML-% IV SOLN
INTRAVENOUS | Status: AC
  Administered 2020-12-09: 2 g via INTRAVENOUS
  Filled 2020-12-09: qty 100

## 2020-12-09 MED ORDER — ALUM & MAG HYDROXIDE-SIMETH 200-200-20 MG/5ML PO SUSP: 30.0000 mL | ORAL | Status: DC | PRN

## 2020-12-09 MED ORDER — DROPERIDOL 2.5 MG/ML IJ SOLN
0.6250 mg | Freq: Once | INTRAMUSCULAR | Status: DC | PRN
  Filled 2020-12-09: qty 2

## 2020-12-09 MED ORDER — PROMETHAZINE HCL 25 MG/ML IJ SOLN: 6.2500 mg | INTRAMUSCULAR | Status: DC | PRN

## 2020-12-09 MED ORDER — DIPHENHYDRAMINE HCL 12.5 MG/5ML PO ELIX
12.5000 mg | ORAL_SOLUTION | ORAL | Status: DC | PRN
  Filled 2020-12-09: qty 10

## 2020-12-09 MED ORDER — ORAL CARE MOUTH RINSE: 15.0000 mL | Freq: Once | OROMUCOSAL | Status: AC

## 2020-12-09 MED ORDER — PROPOFOL 1000 MG/100ML IV EMUL
INTRAVENOUS | Status: AC
  Filled 2020-12-09: qty 100

## 2020-12-09 MED ORDER — MIDAZOLAM HCL 2 MG/2ML IJ SOLN
INTRAMUSCULAR | Status: AC
  Filled 2020-12-09: qty 2

## 2020-12-09 MED ORDER — CHLORHEXIDINE GLUCONATE 0.12 % MT SOLN: 15.0000 mL | Freq: Once | OROMUCOSAL | Status: AC

## 2020-12-09 MED ORDER — TRANEXAMIC ACID-NACL 1000-0.7 MG/100ML-% IV SOLN
INTRAVENOUS | Status: AC
  Filled 2020-12-09: qty 100

## 2020-12-09 MED ORDER — FLUTICASONE PROPIONATE 50 MCG/ACT NA SUSP
1.0000 | NASAL | Status: DC | PRN
  Filled 2020-12-09: qty 16

## 2020-12-09 MED ORDER — PANTOPRAZOLE SODIUM 40 MG PO TBEC
40.0000 mg | DELAYED_RELEASE_TABLET | Freq: Two times a day (BID) | ORAL | Status: DC
  Administered 2020-12-09 – 2020-12-10 (×2): 40 mg via ORAL
  Filled 2020-12-09 (×2): qty 1

## 2020-12-09 MED ORDER — 0.9 % SODIUM CHLORIDE (POUR BTL) OPTIME
TOPICAL | Status: DC | PRN
  Administered 2020-12-09: 500 mL

## 2020-12-09 MED ORDER — LACTATED RINGERS IV SOLN: INTRAVENOUS | Status: DC

## 2020-12-09 MED ORDER — METOCLOPRAMIDE HCL 10 MG PO TABS
10.0000 mg | ORAL_TABLET | Freq: Three times a day (TID) | ORAL | Status: DC
  Administered 2020-12-09 – 2020-12-10 (×5): 10 mg via ORAL
  Filled 2020-12-09 (×5): qty 1

## 2020-12-09 MED ORDER — ENOXAPARIN SODIUM 30 MG/0.3ML IJ SOSY
30.0000 mg | PREFILLED_SYRINGE | Freq: Two times a day (BID) | INTRAMUSCULAR | Status: DC
  Administered 2020-12-10: 30 mg via SUBCUTANEOUS
  Filled 2020-12-09: qty 0.3

## 2020-12-09 MED ORDER — ACETAMINOPHEN 10 MG/ML IV SOLN
1000.0000 mg | Freq: Four times a day (QID) | INTRAVENOUS | Status: AC
  Administered 2020-12-09 – 2020-12-10 (×3): 1000 mg via INTRAVENOUS
  Filled 2020-12-09 (×4): qty 100

## 2020-12-09 SURGICAL SUPPLY — 62 items
BLADE DRUM FLTD (BLADE) ×2 IMPLANT
BLADE SAW 90X25X1.19 OSCILLAT (BLADE) ×2 IMPLANT
BNDG COHESIVE 4X5 TAN ST LF (GAUZE/BANDAGES/DRESSINGS) ×1 IMPLANT
CARTRIDGE OIL MAESTRO DRILL (MISCELLANEOUS) ×1 IMPLANT
DIFFUSER DRILL AIR PNEUMATIC (MISCELLANEOUS) ×2 IMPLANT
DRAPE 3/4 80X56 (DRAPES) ×2 IMPLANT
DRAPE INCISE IOBAN 66X60 STRL (DRAPES) ×2 IMPLANT
DRSG DERMACEA 8X12 NADH (GAUZE/BANDAGES/DRESSINGS) ×2 IMPLANT
DRSG MEPILEX SACRM 8.7X9.8 (GAUZE/BANDAGES/DRESSINGS) ×2 IMPLANT
DRSG OPSITE POSTOP 4X12 (GAUZE/BANDAGES/DRESSINGS) ×2 IMPLANT
DRSG OPSITE POSTOP 4X14 (GAUZE/BANDAGES/DRESSINGS) ×1 IMPLANT
DRSG TEGADERM 4X4.75 (GAUZE/BANDAGES/DRESSINGS) ×2 IMPLANT
DURAPREP 26ML APPLICATOR (WOUND CARE) ×2 IMPLANT
ELECT CAUTERY BLADE 6.4 (BLADE) ×2 IMPLANT
ELECT REM PT RETURN 9FT ADLT (ELECTROSURGICAL) ×2
ELECTRODE REM PT RTRN 9FT ADLT (ELECTROSURGICAL) ×1 IMPLANT
GAUZE 4X4 16PLY ~~LOC~~+RFID DBL (SPONGE) ×2 IMPLANT
GLOVE SURG ENC MOIS LTX SZ7.5 (GLOVE) ×4 IMPLANT
GLOVE SURG ENC TEXT LTX SZ7.5 (GLOVE) ×4 IMPLANT
GLOVE SURG UNDER LTX SZ8 (GLOVE) ×2 IMPLANT
GLOVE SURG UNDER POLY LF SZ7.5 (GLOVE) ×2 IMPLANT
GOWN STRL REUS W/ TWL LRG LVL3 (GOWN DISPOSABLE) ×2 IMPLANT
GOWN STRL REUS W/ TWL XL LVL3 (GOWN DISPOSABLE) ×1 IMPLANT
GOWN STRL REUS W/TWL LRG LVL3 (GOWN DISPOSABLE) ×2
GOWN STRL REUS W/TWL XL LVL3 (GOWN DISPOSABLE) ×1
HANDLE YANKAUER SUCT BULB TIP (MISCELLANEOUS) ×1 IMPLANT
HEAD FEM STD 32X+1 STRL (Hips) ×1 IMPLANT
HEMOVAC 400CC 10FR (MISCELLANEOUS) ×2 IMPLANT
HOLDER FOLEY CATH W/STRAP (MISCELLANEOUS) ×2 IMPLANT
IRRIGATION SURGIPHOR STRL (IV SOLUTION) ×1 IMPLANT
IV NS IRRIG 3000ML ARTHROMATIC (IV SOLUTION) ×2 IMPLANT
KIT PEG BOARD PINK (KITS) ×2 IMPLANT
KIT TURNOVER KIT A (KITS) ×2 IMPLANT
LINER MARATHON 32 50 (Hips) ×1 IMPLANT
MANIFOLD NEPTUNE II (INSTRUMENTS) ×4 IMPLANT
NDL SAFETY ECLIPSE 18X1.5 (NEEDLE) ×1 IMPLANT
NEEDLE HYPO 18GX1.5 SHARP (NEEDLE) ×1
NS IRRIG 500ML POUR BTL (IV SOLUTION) ×2 IMPLANT
OIL CARTRIDGE MAESTRO DRILL (MISCELLANEOUS) ×2
PACK HIP PROSTHESIS (MISCELLANEOUS) ×2 IMPLANT
PENCIL SMOKE EVACUATOR COATED (MISCELLANEOUS) ×2 IMPLANT
PIN SECT CUP 50MM (Hips) ×1 IMPLANT
PIN STEIN THRED 5/32 (Pin) ×2 IMPLANT
PULSAVAC PLUS IRRIG FAN TIP (DISPOSABLE) ×2
SOL PREP PVP 2OZ (MISCELLANEOUS) ×2
SOLUTION PREP PVP 2OZ (MISCELLANEOUS) ×1 IMPLANT
SPONGE DRAIN TRACH 4X4 STRL 2S (GAUZE/BANDAGES/DRESSINGS) ×2 IMPLANT
SPONGE T-LAP 18X18 ~~LOC~~+RFID (SPONGE) ×9 IMPLANT
STAPLER SKIN PROX 35W (STAPLE) ×2 IMPLANT
STEM FEM CMNTLSS LG AML 13.5 (Hips) ×1 IMPLANT
SUT ETHIBOND #5 BRAIDED 30INL (SUTURE) ×2 IMPLANT
SUT VIC AB 0 CT1 36 (SUTURE) ×2 IMPLANT
SUT VIC AB 1 CT1 36 (SUTURE) ×4 IMPLANT
SUT VIC AB 2-0 CT1 27 (SUTURE) ×1
SUT VIC AB 2-0 CT1 TAPERPNT 27 (SUTURE) ×1 IMPLANT
SYR 20ML LL LF (SYRINGE) ×2 IMPLANT
TAPE CLOTH 3X10 WHT NS LF (GAUZE/BANDAGES/DRESSINGS) ×2 IMPLANT
TAPE TRANSPORE STRL 2 31045 (GAUZE/BANDAGES/DRESSINGS) ×2 IMPLANT
TIP FAN IRRIG PULSAVAC PLUS (DISPOSABLE) ×1 IMPLANT
TOWEL OR 17X26 4PK STRL BLUE (TOWEL DISPOSABLE) ×2 IMPLANT
TRAY FOLEY MTR SLVR 16FR STAT (SET/KITS/TRAYS/PACK) ×2 IMPLANT
WATER STERILE IRR 500ML POUR (IV SOLUTION) ×2 IMPLANT

## 2020-12-09 NOTE — Anesthesia Procedure Notes (Addendum)
Spinal  Patient location during procedure: OR Start time: 12/09/2020 7:54 AM End time: 12/09/2020 8:00 AM Reason for block: surgical anesthesia Staffing Performed: resident/CRNA  Resident/CRNA: Junious Silk, CRNA Preanesthetic Checklist Completed: patient identified, IV checked, site marked, risks and benefits discussed, surgical consent, monitors and equipment checked, pre-op evaluation and timeout performed Spinal Block Patient position: sitting Prep: DuraPrep Patient monitoring: heart rate, cardiac monitor, continuous pulse ox and blood pressure Approach: midline Location: L3-4 Injection technique: single-shot Needle Needle type: Whitacre and Introducer  Needle gauge: 25 G Needle length: 9 cm Assessment Sensory level: T10 Events: CSF return

## 2020-12-09 NOTE — Progress Notes (Signed)
Patient's orders called for knee high TEDS; OR RN requested thigh high TEDS to be placed.  RN  reiterated order called for knee high, OR RN verified thigh high were to be placed.

## 2020-12-09 NOTE — Evaluation (Signed)
Physical Therapy Evaluation Patient Details Name: Catherine Hale MRN: [id number] DOB: Oct 08, 1940 Today's Date: 12/09/2020  History of Present Illness  Patient is an 80 year old female that presence to [facility] today (12/09/20) for her R total hip arthroplasty with Dr. Ernest Pine. The pain is located primarily in the right hip and thigh. She describes her pain as worse with weightbearing. PMH includes Hyperlipidemia, HTN, depression, and Pneumonia back in the 70's.  Clinical Impression  Patient tolerated session well, and was agreeable to evaluation/treatment. Patient was educated on proper posterior hip precautions; however requires continuous verbal and visual cueing to ensure proper adherence. Patient was able to tolerate 40 feet ambulating with RW and CGA. Due to pain and LE weakness, heel slides and hip abduction exercises required Min A. Recommend review of HEP packet and posterior total hip precautions would be ideal. Patient's R hip pain increased from a 6/10 to a 7/10 during session (patient pre-medicated with pain medication). Patient would benefit from continued skilled physical therapy upon hospital discharge with home health PT in order to improve RLE ROM, strength, and balance.      Recommendations for follow up therapy are one component of a multi-disciplinary discharge planning process, led by the attending physician.  Recommendations may be updated based on patient status, additional functional criteria and insurance authorization.  Follow Up Recommendations Home health PT    Assistance Recommended at Discharge Frequent or constant Supervision/Assistance  Functional Status Assessment Patient has had a recent decline in their functional status and demonstrates the ability to make significant improvements in function in a reasonable and predictable amount of time.  Equipment Recommendations  Rolling walker (2 wheels)    Recommendations for Other Services OT consult     Precautions /  Restrictions Precautions Precautions: Posterior Hip;Fall Precaution Booklet Issued: Yes (comment) Restrictions Weight Bearing Restrictions: Yes RLE Weight Bearing: Weight bearing as tolerated      Mobility  Bed Mobility Overal bed mobility: Needs Assistance Bed Mobility: Sit to Supine       Sit to supine: Min guard (bilateral legs to lift lower extremities up onto bed)        Transfers Overall transfer level: Needs assistance Equipment used: Rolling walker (2 wheels) Transfers: Sit to/from Stand Sit to Stand: Supervision (VCing on maintaining hip precautions)                Ambulation/Gait Ambulation/Gait assistance: Min guard Gait Distance (Feet): 40 Feet Assistive device: Rolling walker (2 wheels) Gait Pattern/deviations: Step-to pattern;Decreased step length - right;Decreased stance time - right;Decreased stride length;Decreased weight shift to right;Antalgic;Narrow base of support        Stairs            Wheelchair Mobility    Modified Rankin (Stroke Patients Only)       Balance Overall balance assessment: Needs assistance Sitting-balance support: Bilateral upper extremity supported Sitting balance-Leahy Scale: Good   Postural control: Left lateral lean (due to increased discomfort at hemovac site) Standing balance support: Bilateral upper extremity supported Standing balance-Leahy Scale: Fair Standing balance comment: No LOB noted during dynamic balance activites, requires BUE support from RW                             Pertinent Vitals/Pain Pain Assessment: 0-10 Pain Score: 6  (Pain started at 6/10 at initiation of eval, session eneded with pain at 7/10) Pain Location: R anterior groin; hemovac insertion Pain Descriptors / Indicators: Aching;Discomfort;Dull  Pain Intervention(s): Premedicated before session;Repositioned;Monitored during session;Limited activity within patient's tolerance;Ice applied    Home Living  Family/patient expects to be discharged to:: Private residence Living Arrangements: Alone Available Help at Discharge: Friend(s);Family (Daughter will be staying with patient for the next week) Type of Home: House Home Access:  (No steps to enter, 1/2 level outside living room with 2 steps, bilateral handrails present, patient can reach both)       Home Layout: One level        Prior Function Prior Level of Function : Independent/Modified Independent                     Hand Dominance        Extremity/Trunk Assessment   Upper Extremity Assessment Upper Extremity Assessment: Defer to OT evaluation    Lower Extremity Assessment Lower Extremity Assessment: RLE deficits/detail RLE Deficits / Details: Decreased ROM on R due to hip precautions and pain       Communication   Communication: No difficulties  Cognition Arousal/Alertness: Awake/alert Behavior During Therapy: Impulsive (mildly impulsive at time) Overall Cognitive Status: Within Functional Limits for tasks assessed                                          General Comments      Exercises Total Joint Exercises Ankle Circles/Pumps: Right;Supine;AROM (30 reps) Quad Sets: Supine;Both;AROM;10 reps (tactile cueing behind knee bilaterally) Gluteal Sets: Supine;AROM;Both;10 reps Short Arc Quad: Supine;AROM;Right;10 reps (Visual target via PTs hand) Heel Slides: Supine;AROM;Right;10 reps (Min A under R heel) Hip ABduction/ADduction: Supine;AROM;Right;10 reps (Min A under R heel)   Assessment/Plan    PT Assessment Patient needs continued PT services  PT Problem List Decreased strength;Decreased mobility;Decreased range of motion;Decreased coordination;Decreased activity tolerance;Decreased balance;Pain;Decreased knowledge of precautions       PT Treatment Interventions Therapeutic exercise;Gait training;Functional mobility training;Balance training;Therapeutic activities;Patient/family  education    PT Goals (Current goals can be found in the Care Plan section)  Acute Rehab PT Goals Patient Stated Goal: Wants to go home PT Goal Formulation: With patient/family Potential to Achieve Goals: Good    Frequency BID   Barriers to discharge        Co-evaluation               AM-PAC PT "6 Clicks" Mobility  Outcome Measure Help needed turning from your back to your side while in a flat bed without using bedrails?: A Little Help needed moving from lying on your back to sitting on the side of a flat bed without using bedrails?: A Lot Help needed moving to and from a bed to a chair (including a wheelchair)?: A Little Help needed standing up from a chair using your arms (e.g., wheelchair or bedside chair)?: None Help needed to walk in hospital room?: A Little Help needed climbing 3-5 steps with a railing? : Total 6 Click Score: 16    End of Session Equipment Utilized During Treatment: Gait belt Activity Tolerance: Patient tolerated treatment well;Patient limited by pain Patient left: in bed;with family/visitor present;with call bell/phone within reach;with bed alarm set;with SCD's reapplied (Pillows left between patients knee to adhere to posterior hip precautions, heels floating via bilateral towel rolls) Nurse Communication: Mobility status;Weight bearing status;Precautions PT Visit Diagnosis: Other abnormalities of gait and mobility (R26.89);Muscle weakness (generalized) (M62.81);Ataxic gait (R26.0);Pain Pain - Right/Left: Right Pain - part of body: Hip  Time: 0932-3557 PT Time Calculation (min) (ACUTE ONLY): 44 min   Charges:   PT Evaluation $PT Eval Low Complexity: 1 Low PT Treatments $Therapeutic Exercise: 8-22 mins $Therapeutic Activity: 8-22 mins          Carron Brazen, PT 12/09/2020, 3:42 PM

## 2020-12-09 NOTE — Progress Notes (Signed)
   12/09/20 0735  Clinical Encounter Type  Visited With Patient  Visit Type Initial;Spiritual support;Social support  Referral From Chaplain  Consult/Referral To Chaplain   Chaplain established initial pastoral care. Chaplain ministered with a calm presence, reflective listening, and well wishes. There was no family presence in the room, during the visit. PT spoke of being tired and ready to get everything over it.

## 2020-12-09 NOTE — Transfer of Care (Signed)
Immediate Anesthesia Transfer of Care Note  Patient: Catherine Hale  Procedure(s) Performed: TOTAL HIP ARTHROPLASTY (Right: Hip)  Patient Location: PACU  Anesthesia Type:Spinal  Level of Consciousness: awake, alert  and oriented  Airway & Oxygen Therapy: Patient Spontanous Breathing and Patient connected to face mask oxygen  Post-op Assessment: Report given to RN and Post -op Vital signs reviewed and stable  Post vital signs: Reviewed and stable  Last Vitals:  Vitals Value Taken Time  BP    Temp    Pulse    Resp    SpO2      Last Pain:  Vitals:   12/09/20 0620  TempSrc: Tympanic  PainSc: 0-No pain      Patients Stated Pain Goal: 0 (12/09/20 0620)  Complications: No notable events documented.

## 2020-12-09 NOTE — Op Note (Signed)
OPERATIVE NOTE  DATE OF SURGERY:  12/09/2020  PATIENT NAME:  Catherine Hale   DOB: November 29, 1940  MRN: [id number]  PRE-OPERATIVE DIAGNOSIS: Degenerative arthrosis of the right hip, primary  POST-OPERATIVE DIAGNOSIS:  Same  PROCEDURE:  Right total hip arthroplasty  SURGEON:  Jena Gauss. M.D.  ASSISTANT: Baldwin Jamaica, PA-C (present and scrubbed throughout the case, critical for assistance with exposure, retraction, instrumentation, and closure)  ANESTHESIA: spinal  ESTIMATED BLOOD LOSS: 100 mL  FLUIDS REPLACED: 1500 mL of crystalloid  DRAINS: 2 medium Hemovac drains  IMPLANTS UTILIZED: DePuy 13.5 mm large stature AML femoral stem, 50 mm OD Pinnacle 100 acetabular component, +4 mm neutral Pinnacle Marathon polyethylene insert, and a 32 mm CoCr +1 mm hip ball  INDICATIONS FOR SURGERY: Catherine Hale is a 80 y.o. year old female with a long history of progressive hip and groin  pain. X-rays demonstrated severe degenerative changes. The patient had not seen any significant improvement despite conservative nonsurgical intervention. After discussion of the risks and benefits of surgical intervention, the patient expressed understanding of the risks benefits and agree with plans for total hip arthroplasty.   The risks, benefits, and alternatives were discussed at length including but not limited to the risks of infection, bleeding, nerve injury, stiffness, blood clots, the need for revision surgery, limb length inequality, dislocation, cardiopulmonary complications, among others, and they were willing to proceed.  PROCEDURE IN DETAIL: The patient was brought into the operating room and, after adequate spinal anesthesia was achieved, the patient was placed in a left lateral decubitus position. Axillary roll was placed and all bony prominences were well-padded. The patient's right hip was cleaned and prepped with alcohol and DuraPrep and draped in the usual sterile fashion. A "timeout" was  performed as per usual protocol. A lateral curvilinear incision was made gently curving towards the posterior superior iliac spine. The IT band was incised in line with the skin incision and the fibers of the gluteus maximus were split in line. The piriformis tendon was identified, skeletonized, and incised at its insertion to the proximal femur and reflected posteriorly. A T type posterior capsulotomy was performed. Prior to dislocation of the femoral head, a threaded Steinmann pin was inserted through a separate stab incision into the pelvis superior to the acetabulum and bent in the form of a stylus so as to assess limb length and hip offset throughout the procedure. The femoral head was then dislocated posteriorly. Inspection of the femoral head demonstrated severe degenerative changes with full-thickness loss of articular cartilage. The femoral neck cut was performed using an oscillating saw. The anterior capsule was elevated off of the femoral neck using a periosteal elevator. Attention was then directed to the acetabulum. The remnant of the labrum was excised using electrocautery. Inspection of the acetabulum also demonstrated significant degenerative changes. The acetabulum was reamed in sequential fashion up to a 49 mm diameter. Good punctate bleeding bone was encountered. A 50 mm Pinnacle 100 acetabular component was positioned and impacted into place. Good scratch fit was appreciated. A +4 mm neutral polyethylene trial was inserted.  Attention was then directed to the proximal femur. A hole for reaming of the proximal femoral canal was created using a high-speed burr. The femoral canal was reamed in sequential fashion up to a 13 mm diameter. This allowed for approximately 7 cm of scratch fit.  It was thus elected to ream up to a 13.5 mm diameter to allow for a line to line fit.  Serial  broaches were inserted up to a 13.5 mm large stature femoral broach. Calcar region was planed and a trial reduction was  performed using a 32 mm hip ball with a +1 mm neck length. Good equalization of limb lengths and hip offset was appreciated and excellent stability was noted both anteriorly and posteriorly. Trial components were removed. The acetabular shell was irrigated with copious amounts of normal saline with antibiotic solution and suctioned dry. A +4 mm neutral Pinnacle Marathon polyethylene insert was positioned and impacted into place. Next, a 13.5 mm large stature AML femoral stem was positioned and impacted into place. Excellent scratch fit was appreciated. A trial reduction was again performed with a 32 mm hip ball with a +1 mm neck length. Again, good equalization of limb lengths was appreciated and excellent stability appreciated both anteriorly and posteriorly. The hip was then dislocated and the trial hip ball was removed. The Morse taper was cleaned and dried. A 32 mm cobalt chrome hip ball with a +1 mm neck length was placed on the trunnion and impacted into place. The hip was then reduced and placed through range of motion. Excellent stability was appreciated both anteriorly and posteriorly.  The wound was irrigated with copious amounts of normal saline followed by 500 ml of Surgiphor and suctioned dry. Good hemostasis was appreciated. The posterior capsulotomy was repaired using #5 Ethibond. Piriformis tendon was reapproximated to the undersurface of the gluteus medius tendon using #5 Ethibond. The IT band was reapproximated using interrupted sutures of #1 Vicryl. Subcutaneous tissue was approximated using first #0 Vicryl followed by #2-0 Vicryl. The skin was closed with skin staples.  The patient tolerated the procedure well and was transported to the recovery room in stable condition.   Jena Gauss., M.D.

## 2020-12-09 NOTE — H&P (Signed)
The patient has been re-examined, and the chart reviewed, and there have been no interval changes to the documented history and physical.    The risks, benefits, and alternatives have been discussed at length. The patient expressed understanding of the risks benefits and agreed with plans for surgical intervention.  Coralynn Gaona P. Denece Shearer, Jr. M.D.    

## 2020-12-10 DIAGNOSIS — M1611 Unilateral primary osteoarthritis, right hip: Secondary | ICD-10-CM | POA: Diagnosis not present

## 2020-12-10 MED ORDER — OXYCODONE HCL 5 MG PO TABS: 5.0000 mg | ORAL_TABLET | ORAL | 0 refills | Status: DC | PRN

## 2020-12-10 MED ORDER — TRAMADOL HCL 50 MG PO TABS: 50.0000 mg | ORAL_TABLET | ORAL | 0 refills | Status: DC | PRN

## 2020-12-10 MED ORDER — ENOXAPARIN SODIUM 40 MG/0.4ML IJ SOSY: 40.0000 mg | PREFILLED_SYRINGE | INTRAMUSCULAR | 0 refills | Status: DC

## 2020-12-10 MED ORDER — CELECOXIB 200 MG PO CAPS: 200.0000 mg | ORAL_CAPSULE | Freq: Two times a day (BID) | ORAL | 0 refills | Status: DC

## 2020-12-10 NOTE — Care Management Obs Status (Signed)
MEDICARE OBSERVATION STATUS NOTIFICATION   Patient Details  Name: Catherine Hale MRN: [id number] Date of Birth: 1940/08/30   Medicare Observation Status Notification Given:  Yes    Margarito Liner, LCSW 12/10/2020, 1:14 PM

## 2020-12-10 NOTE — Plan of Care (Signed)
Hemovac removed. and Mini compression dressing applied.   

## 2020-12-10 NOTE — Progress Notes (Signed)
  Subjective: 1 Day Post-Op Procedure(s) (LRB): TOTAL HIP ARTHROPLASTY (Right) Daughter asleep at bedside. Patient reports pain as 0 on 0-10 scale.   Patient is well, and has had no acute complaints or problems Plan is to go Home after hospital stay. Negative for chest pain and shortness of breath Fever: no Gastrointestinal: negative for nausea and vomiting.  Patient has not had a bowel movement.  Objective: Vital signs in last 24 hours: Temp:  [96.8 F (36 C)-98.2 F (36.8 C)] 98.2 F (36.8 C) (10/25 0421) Pulse Rate:  [67-97] 78 (10/25 0421) Resp:  [12-20] 20 (10/25 0421) BP: (112-135)/(64-93) 112/64 (10/25 0421) SpO2:  [90 %-100 %] 90 % (10/25 0421)  Intake/Output from previous day:  Intake/Output Summary (Last 24 hours) at 12/10/2020 0802 Last data filed at 12/10/2020 9163 Gross per 24 hour  Intake 1900 ml  Output 1110 ml  Net 790 ml    Intake/Output this shift: No intake/output data recorded.  Labs: No results for input(s): HGB in the last 72 hours. No results for input(s): WBC, RBC, HCT, PLT in the last 72 hours. No results for input(s): NA, K, CL, CO2, BUN, CREATININE, GLUCOSE, CALCIUM in the last 72 hours. No results for input(s): LABPT, INR in the last 72 hours.   EXAM General - Patient is Alert, Appropriate, and Oriented Extremity - Neurovascular intact Dorsiflexion/Plantar flexion intact Compartment soft Dressing/Incision -clean, dry, no drainage, Hemovac in place.  Motor Function - intact, moving foot and toes well on exam.    Cardiovascular- Regular rate and rhythm, no murmurs/rubs/gallops Respiratory- Lungs clear to auscultation bilaterally Gastrointestinal- soft, nontender, and active bowel sounds   Assessment/Plan: 1 Day Post-Op Procedure(s) (LRB): TOTAL HIP ARTHROPLASTY (Right) Active Problems:   Hx of total hip arthroplasty, right  Estimated body mass index is 33.99 kg/m as calculated from the following:   Height as of this encounter: 5'  4" (1.626 m).   Weight as of this encounter: 89.8 kg. Advance diet Up with therapy Maintain posterior hip precautions   Possible d/c today pending completion of PT goals.      DVT Prophylaxis - Lovenox, Ted hose, and foot pumps Weight-Bearing as tolerated to right leg  Baldwin Jamaica, PA-C [facility] 12/10/2020, 8:02 AM

## 2020-12-10 NOTE — Progress Notes (Signed)
Physical Therapy Treatment Patient Details Name: Catherine Hale MRN: [id number] DOB: [birth year] Today's Date: 12/10/2020   History of Present Illness Patient is an 80 year old female that presence to [facility] today (12/09/20) for her R total hip arthroplasty with Dr. Ernest Pine. The pain is located primarily in the right hip and thigh. She describes her pain as worse with weightbearing. PMH includes Hyperlipidemia, HTN, depression, and Pneumonia back in the 70's.    PT Comments    Pt resting in bed upon PT arrival; agreeable to PT session.  SBA with bed mobility; SBA with transfers using RW; CGA progressing to SBA with ambulation 100 feet x2 with RW; and CGA navigating 4 steps with UE support (simulating home set-up).  Pt steady and safe with functional mobility during sessions activities.  5/10 R hip pain at rest end of session (pt received pain meds beginning of session).  Pt able to state 2/3 posterior hip precautions beginning of session (daughter able to state 3rd precaution) and pt able to state 3/3 posterior hip precautions end of session.  Educated pt and pt's daughter on safe car transfer technique/set-up and home safety: both appearing with appropriate understanding.  Pt appears safe to discharge home with support of daughter when medically appropriate (pt's daughter plans to assist pt upon hospital discharge already).    Recommendations for follow up therapy are one component of a multi-disciplinary discharge planning process, led by the attending physician.  Recommendations may be updated based on patient status, additional functional criteria and insurance authorization.  Follow Up Recommendations  Home health PT     Assistance Recommended at Discharge Frequent or constant Supervision/Assistance  Equipment Recommendations  Rolling walker (2 wheels)    Recommendations for Other Services OT consult     Precautions / Restrictions Precautions Precautions: Posterior Hip;Fall Precaution  Booklet Issued: Yes (comment) Restrictions Weight Bearing Restrictions: Yes RLE Weight Bearing: Weight bearing as tolerated     Mobility  Bed Mobility Overal bed mobility: Needs Assistance Bed Mobility: Supine to Sit;Sit to Supine     Supine to sit: Supervision Sit to supine: Supervision   General bed mobility comments: increased time/effort for pt to perform on own    Transfers Overall transfer level: Needs assistance Equipment used: Rolling walker (2 wheels) Transfers: Sit to/from Stand Sit to Stand: Supervision           General transfer comment: vc's for positioning when sitting    Ambulation/Gait Ambulation/Gait assistance: Min guard;Supervision Gait Distance (Feet):  (100 feet x2 (performed stairs between ambulation trials)) Assistive device: Rolling walker (2 wheels)   Gait velocity: decreased   General Gait Details: partial step through gait pattern; mild decreased stance time R LE; steady   Stairs Stairs: Yes Stairs assistance: Min guard Stair Management: Step to pattern;Forwards Number of Stairs: 4 General stair comments: simulated hand hold on railings for pt's B grab bar home set-up (used B railings ascending and pt's L sided railing descending); initial vc's for technique; steady and safe   Wheelchair Mobility    Modified Rankin (Stroke Patients Only)       Balance Overall balance assessment: Needs assistance Sitting-balance support: No upper extremity supported;Feet supported Sitting balance-Leahy Scale: Normal Sitting balance - Comments: steady sitting reaching outside BOS   Standing balance support: No upper extremity supported Standing balance-Leahy Scale: Good Standing balance comment: steady standing reaching within BOS  Cognition Arousal/Alertness: Awake/alert Behavior During Therapy: WFL for tasks assessed/performed Overall Cognitive Status: Within Functional Limits for tasks assessed                                           Exercises Total Joint Exercises Long Arc Quad: AROM;Strengthening;Right;10 reps;Seated    General Comments General comments (skin integrity, edema, etc.): R hip hemovac drain in place Pt agreeable to PT session.      Pertinent Vitals/Pain Pain Assessment: 0-10 Pain Score: 5  Pain Location: R thigh Pain Descriptors / Indicators: Aching;Sore Pain Intervention(s): Limited activity within patient's tolerance;Monitored during session;Repositioned;RN gave pain meds during session Vitals (HR and O2 on room air) stable and WFL throughout treatment session.    Home Living                          Prior Function            PT Goals (current goals can now be found in the care plan section) Acute Rehab PT Goals Patient Stated Goal: to go home today PT Goal Formulation: With patient/family Time For Goal Achievement: 12/23/20 Potential to Achieve Goals: Good Progress towards PT goals: Progressing toward goals    Frequency    BID      PT Plan Current plan remains appropriate    Co-evaluation              AM-PAC PT "6 Clicks" Mobility   Outcome Measure  Help needed turning from your back to your side while in a flat bed without using bedrails?: A Little Help needed moving from lying on your back to sitting on the side of a flat bed without using bedrails?: A Little Help needed moving to and from a bed to a chair (including a wheelchair)?: A Little Help needed standing up from a chair using your arms (e.g., wheelchair or bedside chair)?: A Little Help needed to walk in hospital room?: A Little Help needed climbing 3-5 steps with a railing? : A Little 6 Click Score: 18    End of Session Equipment Utilized During Treatment: Gait belt Activity Tolerance: Patient tolerated treatment well Patient left: with call bell/phone within reach;with family/visitor present;in bed;with bed alarm set;with SCD's  reapplied;Other (comment) (pillow between pt's knees; B heels floating via towel rolls) Nurse Communication: Mobility status;Precautions;Weight bearing status PT Visit Diagnosis: Other abnormalities of gait and mobility (R26.89);Muscle weakness (generalized) (M62.81);Ataxic gait (R26.0);Pain Pain - Right/Left: Right Pain - part of body: Hip     Time: 7902-4097 PT Time Calculation (min) (ACUTE ONLY): 32 min  Charges:  $Gait Training: 8-22 mins $Therapeutic Activity: 8-22 mins                    Hendricks Limes, PT 12/10/20, 5:14 PM

## 2020-12-10 NOTE — Anesthesia Postprocedure Evaluation (Signed)
Anesthesia Post Note  Patient: Catherine Hale  Procedure(s) Performed: TOTAL HIP ARTHROPLASTY (Right: Hip)  Patient location during evaluation: PACU Anesthesia Type: Spinal Level of consciousness: awake and alert Pain management: pain level controlled Vital Signs Assessment: post-procedure vital signs reviewed and stable Respiratory status: spontaneous breathing, nonlabored ventilation and respiratory function stable Cardiovascular status: blood pressure returned to baseline and stable Postop Assessment: no apparent nausea or vomiting Anesthetic complications: no   No notable events documented.   Last Vitals:  Vitals:   12/09/20 2050 12/10/20 0421  BP: 118/75 112/64  Pulse: 80 78  Resp: 20 20  Temp: 36.7 C 36.8 C  SpO2: 91% 90%    Last Pain:  Vitals:   12/10/20 0421  TempSrc: Oral  PainSc:                  Foye Deer

## 2020-12-10 NOTE — Evaluation (Signed)
Occupational Therapy Evaluation Patient Details Name: Catherine Hale MRN: [id number] DOB: [birth year] Today's Date: 12/10/2020   History of Present Illness Patient is an 80 year old female that presence to [facility] (12/09/20) for her R total hip arthroplasty with Dr. Ernest Pine. The pain is located primarily in the right hip and thigh. She describes her pain as worse with weightbearing. PMH includes Hyperlipidemia, HTN, depression, and Pneumonia back in the 70's.   Clinical Impression   Pt seen for OT evaluation this date, POD#1 from above surgery. Upon arrival to room, pt seated upright in bed with daughter present. Pt was independent in all ADLs prior to surgery, however occasionally using SPC/RW for mobility d/t R LE pain. Pt is eager to return to PLOF with less pain and improved safety and independence. Pt was able to recall 2/3 posterior total hip precautions at start of session. Pt re-educated in posterior total hip precautions and was instructed on implementation during self care skills, falls prevention strategies, home/routines modifications, and DME/AE for LB bathing and dressing tasks; handout provided and pt & daughter verbalized understanding. Pt currently requires SUPERVISION for toilet transfers, SUPERVISION for functional mobility with RW, and SUPERVISION for LB dressing with AD while in seated position due to pain, limited AROM of R hip, and decreased awareness of posterior hip precautions. Pt would benefit from additional instruction in self care skills and techniques to help maintain precautions with or without assistive devices to support recall and carryover prior to discharge. Recommend HHOT upon discharge.        Recommendations for follow up therapy are one component of a multi-disciplinary discharge planning process, led by the attending physician.  Recommendations may be updated based on patient status, additional functional criteria and insurance authorization.   Follow Up  Recommendations  Home health OT    Assistance Recommended at Discharge Intermittent Supervision/Assistance  Functional Status Assessment  Patient has had a recent decline in their functional status and demonstrates the ability to make significant improvements in function in a reasonable and predictable amount of time.  Equipment Recommendations  BSC;Tub/shower bench       Precautions / Restrictions Precautions Precautions: Posterior Hip;Fall Precaution Booklet Issued: Yes (comment) Restrictions Weight Bearing Restrictions: Yes RLE Weight Bearing: Weight bearing as tolerated      Mobility Bed Mobility Overal bed mobility: Needs Assistance Bed Mobility: Supine to Sit     Supine to sit: Supervision;HOB elevated Sit to supine: Supervision   General bed mobility comments: With use of bedrails and increased time/effort, pt able to perform without physical assist    Transfers Overall transfer level: Needs assistance Equipment used: Rolling walker (2 wheels) Transfers: Sit to/from Stand Sit to Stand: Supervision           General transfer comment: vc's for UE/LE placement and posterior THP's      Balance Overall balance assessment: Needs assistance Sitting-balance support: No upper extremity supported;Feet supported Sitting balance-Leahy Scale: Good Sitting balance - Comments: steady sitting reaching within BOS   Standing balance support: Bilateral upper extremity supported;During functional activity Standing balance-Leahy Scale: Fair Standing balance comment: Able to walk household distances with b/l UE support from RW                           ADL either performed or assessed with clinical judgement   ADL Overall ADL's : Needs assistance/impaired  Lower Body Dressing: Supervision/safety;Set up;Sitting/lateral leans;Adhering to hip precautions;With adaptive equipment Lower Body Dressing Details (indicate cue type and reason):  to don/doff socks with reacher and sock aide Toilet Transfer: Supervision/safety;Ambulation;Rolling walker (2 wheels);BSC Toilet Transfer Details (indicate cue type and reason): Requires verbal cues for safe hand placement with RW use         Functional mobility during ADLs: Supervision/safety;Rolling walker (2 wheels)        Pertinent Vitals/Pain Pain Assessment: 0-10 Pain Score: 2  Pain Location: R thigh Pain Descriptors / Indicators: Aching;Sore Pain Intervention(s): Limited activity within patient's tolerance;Monitored during session;Repositioned     Hand Dominance Right   Extremity/Trunk Assessment Upper Extremity Assessment Upper Extremity Assessment: Overall WFL for tasks assessed   Lower Extremity Assessment Lower Extremity Assessment: RLE deficits/detail RLE Deficits / Details: Decreased ROM on R due to hip precautions and pain       Communication Communication Communication: No difficulties   Cognition Arousal/Alertness: Awake/alert Behavior During Therapy: WFL for tasks assessed/performed Overall Cognitive Status: Within Functional Limits for tasks assessed                                       General Comments  R hip hemovac drain in place    Exercises Total Joint Exercises Ankle Circles/Pumps: AROM;Strengthening;Both;10 reps;Supine Quad Sets: AROM;Strengthening;Both;10 reps;Supine Short Arc Quad: AROM;Strengthening;Right;10 reps;Supine Heel Slides: AAROM;Strengthening;Right;10 reps;Supine Hip ABduction/ADduction: AAROM;Strengthening;Right;10 reps;Supine Other Exercises Other Exercises: Pt instructed in posterior total hip precautions and how to implement, self care skills, falls prevention strategies, home/routines modifications, and DME/AE for LB bathing and dressing tasks; handout provided and pt verbalized understanding        Home Living Family/patient expects to be discharged to:: Private residence Living Arrangements:  Alone Available Help at Discharge: Friend(s);Family (Daughter will be staying with patient for the next week) Type of Home: House Home Access:  (No steps to enter, 1/2 level outside living room with 2 steps, bilateral handrails present, patient can reach both)     Home Layout: One level     Bathroom Shower/Tub: Chief Strategy Officer: Handicapped height Bathroom Accessibility: Yes   Home Equipment: Grab bars - toilet;Grab bars - tub/shower;Cane - single point;Wheelchair - Forensic psychologist (2 wheels)          Prior Functioning/Environment Prior Level of Function : Independent/Modified Independent             Mobility Comments: RW for walking in community. Occassionally using cane at home ADLs Comments: Independent with ADLs. Family assists with cleaning. Pt cooks and drives        OT Problem List: Decreased strength;Decreased range of motion;Impaired balance (sitting and/or standing);Decreased knowledge of precautions;Decreased knowledge of use of DME or AE;Pain      OT Treatment/Interventions: Self-care/ADL training;Therapeutic exercise;Energy conservation;DME and/or AE instruction;Therapeutic activities;Patient/family education;Balance training    OT Goals(Current goals can be found in the care plan section) Acute Rehab OT Goals Patient Stated Goal: to go home OT Goal Formulation: With patient Time For Goal Achievement: 12/24/20 Potential to Achieve Goals: Good ADL Goals Pt Will Perform Grooming: with modified independence;standing Pt Will Perform Lower Body Dressing: with modified independence;with adaptive equipment;sit to/from stand Pt Will Transfer to Toilet: with modified independence;ambulating;bedside commode  OT Frequency: Min 1X/week    AM-PAC OT "6 Clicks" Daily Activity     Outcome Measure Help from another person eating meals?: None Help from another  person taking care of personal grooming?: A Little Help from another person toileting,  which includes using toliet, bedpan, or urinal?: A Little Help from another person bathing (including washing, rinsing, drying)?: A Lot Help from another person to put on and taking off regular upper body clothing?: None Help from another person to put on and taking off regular lower body clothing?: A Little 6 Click Score: 19   End of Session Equipment Utilized During Treatment: Rolling walker (2 wheels) Nurse Communication: Mobility status  Activity Tolerance: Patient tolerated treatment well Patient left: in bed;with call bell/phone within reach;with bed alarm set;with family/visitor present  OT Visit Diagnosis: Unsteadiness on feet (R26.81);Pain Pain - Right/Left: Right Pain - part of body: Leg                Time: 1115-1155 OT Time Calculation (min): 40 min Charges:  OT General Charges $OT Visit: 1 Visit OT Evaluation $OT Eval Moderate Complexity: 1 Mod OT Treatments $Self Care/Home Management : 23-37 mins  Matthew Folks, OTR/L [facility] [phone]

## 2020-12-10 NOTE — Progress Notes (Signed)
Physical Therapy Treatment Patient Details Name: Catherine Hale MRN: [id number] DOB: March 28, 1940 Today's Date: 12/10/2020   History of Present Illness Patient is an 80 year old female that presence to [facility] today (12/09/20) for her R total hip arthroplasty with Dr. Ernest Pine. The pain is located primarily in the right hip and thigh. She describes her pain as worse with weightbearing. PMH includes Hyperlipidemia, HTN, depression, and Pneumonia back in the 70's.    PT Comments    Pt resting in bed upon PT arrival; agreeable to PT session.  SBA supine to sitting edge of bed; CGA with transfers using RW; and CGA to ambulate 200 feet with RW.  Pain 3/10 R hip/thigh during/end of session.  Pt requiring intermittent vc's for posterior hip precautions with transfers/mobility during session.  Will plan to attempt stairs this afternoon and progress functional mobility per pt tolerance.    Recommendations for follow up therapy are one component of a multi-disciplinary discharge planning process, led by the attending physician.  Recommendations may be updated based on patient status, additional functional criteria and insurance authorization.  Follow Up Recommendations  Home health PT     Assistance Recommended at Discharge Frequent or constant Supervision/Assistance  Equipment Recommendations  Rolling walker (2 wheels)    Recommendations for Other Services OT consult     Precautions / Restrictions Precautions Precautions: Posterior Hip;Fall Precaution Booklet Issued: Yes (comment) Restrictions Weight Bearing Restrictions: Yes RLE Weight Bearing: Weight bearing as tolerated     Mobility  Bed Mobility Overal bed mobility: Needs Assistance Bed Mobility: Supine to Sit     Supine to sit: Supervision (bed flat)     General bed mobility comments: mild increased effort/time to perform on own    Transfers Overall transfer level: Needs assistance Equipment used: Rolling walker (2  wheels) Transfers: Sit to/from Stand Sit to Stand: Min guard           General transfer comment: vc's for UE/LE placement and posterior THP's; increased effort/time to stand from bed up to RW    Ambulation/Gait Ambulation/Gait assistance: Min guard Gait Distance (Feet): 200 Feet Assistive device: Rolling walker (2 wheels)   Gait velocity: decreased   General Gait Details: partial step through gait pattern; mild decreased stance time R LE; initial vc's for walker use and positioning; steady   Stairs             Wheelchair Mobility    Modified Rankin (Stroke Patients Only)       Balance Overall balance assessment: Needs assistance Sitting-balance support: No upper extremity supported;Feet supported Sitting balance-Leahy Scale: Good Sitting balance - Comments: steady sitting reaching within BOS   Standing balance support: No upper extremity supported Standing balance-Leahy Scale: Fair Standing balance comment: steady static standing no UE support                            Cognition Arousal/Alertness: Awake/alert Behavior During Therapy: WFL for tasks assessed/performed Overall Cognitive Status: Within Functional Limits for tasks assessed                                          Exercises Total Joint Exercises Ankle Circles/Pumps: AROM;Strengthening;Both;10 reps;Supine Quad Sets: AROM;Strengthening;Both;10 reps;Supine Short Arc Quad: AROM;Strengthening;Right;10 reps;Supine Heel Slides: AAROM;Strengthening;Right;10 reps;Supine Hip ABduction/ADduction: AAROM;Strengthening;Right;10 reps;Supine    General Comments General comments (skin integrity, edema, etc.): R  hip hemovac drain in place.  Pt reports having RW, manual w/c, and SPC at home.      Pertinent Vitals/Pain Pain Score: 3  Pain Location: R hip/thigh/hemovac insertion site Pain Descriptors / Indicators: Discomfort;Sore Pain Intervention(s): Limited activity within  patient's tolerance;Monitored during session;Premedicated before session;Repositioned;Other (comment) (RN notified of pt's pain status) Vitals (HR and O2 on room air) stable and WFL throughout treatment session.    Home Living                          Prior Function            PT Goals (current goals can now be found in the care plan section) Acute Rehab PT Goals Patient Stated Goal: to go home today PT Goal Formulation: With patient/family Time For Goal Achievement: 12/23/20 Potential to Achieve Goals: Good Progress towards PT goals: Progressing toward goals    Frequency    BID      PT Plan Current plan remains appropriate    Co-evaluation              AM-PAC PT "6 Clicks" Mobility   Outcome Measure  Help needed turning from your back to your side while in a flat bed without using bedrails?: A Little Help needed moving from lying on your back to sitting on the side of a flat bed without using bedrails?: A Little Help needed moving to and from a bed to a chair (including a wheelchair)?: A Little Help needed standing up from a chair using your arms (e.g., wheelchair or bedside chair)?: A Little Help needed to walk in hospital room?: A Little Help needed climbing 3-5 steps with a railing? : A Little 6 Click Score: 18    End of Session Equipment Utilized During Treatment: Gait belt Activity Tolerance: Patient tolerated treatment well Patient left: in chair;with call bell/phone within reach;with chair alarm set;with family/visitor present;Other (comment) (pt reports nursing giving her a break from SCD's (so not set up); pt requesting LE's to be down during breakfast so pillow placed between pt's thighs/knees for post THP's--pt to call nursing after eating to elevate LE's, position pillows, and donn SCD's) Nurse Communication: Mobility status;Precautions;Weight bearing status (pt's pain status) PT Visit Diagnosis: Other abnormalities of gait and mobility  (R26.89);Muscle weakness (generalized) (M62.81);Ataxic gait (R26.0);Pain Pain - Right/Left: Right Pain - part of body: Hip     Time: 8921-1941 PT Time Calculation (min) (ACUTE ONLY): 32 min  Charges:  $Therapeutic Exercise: 8-22 mins $Therapeutic Activity: 8-22 mins                     Hendricks Limes, PT 12/10/20, 9:55 AM

## 2020-12-10 NOTE — TOC Initial Note (Signed)
Transition of Care [facility]) - Initial/Assessment Note    Patient Details  Name: Catherine Hale MRN: [id number] Date of Birth: 1940-06-20  Transition of [facility]) CM/SW Contact:    Candie Chroman, LCSW Phone Number: 12/10/2020, 1:18 PM  Clinical Narrative:   CSW met with patient. Daughter at bedside. CSW introduced role and explained that therapy recommendations would be discussed. Patient is agreeable to home health and confirmed she was set up with [facility] prior to admission. Will notify then when discharge orders are entered. Patient already has a rolling walker at home. She is agreeable to a 3-in-1 if there is no copay. [employer] representative said there will be a small copay but they will give patient that amount before they deliver. No further concerns. CSW encouraged patient to contact CSW as needed. CSW will continue to follow patient for support and facilitate return home when stable.               Expected Discharge Plan: [place] Barriers to Discharge: Continued Medical Work up   Patient Goals and CMS Choice     Choice offered to / list presented to : Patient  Expected Discharge Plan and Services Expected Discharge Plan: [place] Acute Care Choice: Home Health, Durable Medical Equipment Living arrangements for the past 2 months: Single Family Home                 DME Arranged: 3-N-1 DME Agency: [employer] Date DME Agency Contacted: 12/10/20   Representative spoke with at DME Agency: Freda Munro HH Arranged: PT [place]: [place] Date [place]: 12/10/20   Representative spoke with at [place]: Was set up prior to admission.  Prior Living Arrangements/Services Living arrangements for the past 2 months: Single Family Home Lives with:: Self Patient language and need for interpreter reviewed:: Yes Do you feel safe going back to the place where you live?: Yes      Need for Family Participation in  Patient Care: Yes (Comment) Care giver support system in place?: Yes (comment) Current home services: DME Criminal Activity/Legal Involvement Pertinent to Current Situation/Hospitalization: No - Comment as needed  Activities of Daily Living Home Assistive Devices/Equipment: Eyeglasses, Dentures (specify type) (upper and lower dentures) ADL Screening (condition at time of admission) Patient's cognitive ability adequate to safely complete daily activities?: Yes Is the patient deaf or have difficulty hearing?: No Does the patient have difficulty seeing, even when wearing glasses/contacts?: No Does the patient have difficulty concentrating, remembering, or making decisions?: No Patient able to express need for assistance with ADLs?: Yes Does the patient have difficulty dressing or bathing?: No Independently performs ADLs?: Yes (appropriate for developmental age) Does the patient have difficulty walking or climbing stairs?: Yes Weakness of Legs: Right Weakness of Arms/Hands: None  Permission Sought/Granted Permission sought to share information with : Facility Art therapist granted to share information with : Yes, Verbal Permission Granted     Permission granted to share info w AGENCY: [place]        Emotional Assessment Appearance:: Appears stated age Attitude/Demeanor/Rapport: Engaged, Gracious Affect (typically observed): Accepting, Appropriate, Calm, Pleasant Orientation: : Oriented to Self, Oriented to Place, Oriented to  Time, Oriented to Situation Alcohol / Substance Use: Not Applicable Psych Involvement: No (comment)  Admission diagnosis:  Hx of total hip arthroplasty, right [Z96.641] Patient Active Problem List   Diagnosis Date Noted   Bursitis 12/09/2020  Depression 12/09/2020   Hyperlipidemia 12/09/2020   Hypertension 12/09/2020   Osteoporosis, 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

## 2020-12-10 NOTE — Discharge Summary (Signed)
Physician Discharge Summary  Patient ID: Catherine Hale MRN: [id number] DOB/AGE: 80-Jun-1942 80 y.o.  Admit date: 12/09/2020 Discharge date: 12/10/2020  Admission Diagnoses:  Hx of total hip arthroplasty, right [Z96.641]  Surgeries:Procedure(s): Degenerative arthrosis of the right hip, primary   POST-OPERATIVE DIAGNOSIS:  Same   PROCEDURE:  Right total hip arthroplasty   SURGEON:  Jena Gauss. M.D.   ASSISTANT: Baldwin Jamaica, PA-C (present and scrubbed throughout the case, critical for assistance with exposure, retraction, instrumentation, and closure)   ANESTHESIA: spinal   ESTIMATED BLOOD LOSS: 100 mL   FLUIDS REPLACED: 1500 mL of crystalloid   DRAINS: 2 medium Hemovac drains   IMPLANTS UTILIZED: DePuy 13.5 mm large stature AML femoral stem, 50 mm OD Pinnacle 100 acetabular component, +4 mm neutral Pinnacle Marathon polyethylene insert, and a 32 mm CoCr +1 mm hip ball  Discharge Diagnoses: Patient Active Problem List   Diagnosis Date Noted   Bursitis 12/09/2020   Depression 12/09/2020   Hyperlipidemia 12/09/2020   Hypertension 12/09/2020   Osteoporosis, 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   Panic attacks 12/09/2020   Pneumonia 12/09/2020   Hx of total hip arthroplasty, right 12/09/2020   Acute post-traumatic headache, not intractable 01/24/2014   Dizziness 01/24/2014   Expressive aphasia 01/24/2014   Headache, unspecified headache type 01/24/2014   Neuralgia 01/24/2014    Past Medical History:  Diagnosis Date   Arthritis    Breast mass 2014   report requested 6 mo f/u cysts rt, pt did not return   Bronchitis    chronic   Complication of anesthesia    hard to wake up   Hyperlipidemia    Hypertension    Osteoporosis    Panic attacks    Pneumonia    h/o     Transfusion:    Consultants (if any):   Discharged Condition: Improved  Hospital Course: Catherine Hale is an 80 y.o. female who was admitted 12/09/2020 with a diagnosis of right hip  osteoarthritis and went to the operating room on 12/09/2020 and underwent right total hip arthroplasty through posterior approach. The patient received perioperative antibiotics for prophylaxis (see below). The patient tolerated the procedure well and was transported to PACU in stable condition. After meeting PACU criteria, the patient was subsequently transferred to the Orthopaedics/Rehabilitation unit.   The patient received DVT prophylaxis in the form of early mobilization, Lovenox, Foot Pumps, and TED hose. A sacral pad had been placed and heels were elevated off of the bed with rolled towels in order to protect skin integrity. Foley catheter was discontinued on postoperative day #0. Wound drains were discontinued on postoperative day #1. The surgical incision was healing well without signs of infection.  Physical therapy was initiated postoperatively for transfers, gait training, and strengthening. Occupational therapy was initiated for activities of daily living and evaluation for assisted devices. Rehabilitation goals were reviewed in detail with the patient. The patient made steady progress with physical therapy and physical therapy recommended discharge to Home.   The patient achieved the preliminary goals of this hospitalization and was felt to be medically and orthopaedically appropriate for discharge.  She was given perioperative antibiotics:  Anti-infectives (From admission, onward)    Start     Dose/Rate Route Frequency Ordered Stop   12/09/20 1400  ceFAZolin (ANCEF) IVPB 2g/100 mL premix        2 g 200 mL/hr over 30 Minutes Intravenous Every 6 hours 12/09/20 1202 12/09/20 2120   12/09/20 0617  ceFAZolin (ANCEF) 2-4 GM/100ML-% IVPB  Note to Pharmacy: Kerman Passey, Cryst: cabinet override      12/09/20 0617 12/09/20 2120   12/09/20 0600  ceFAZolin (ANCEF) IVPB 2g/100 mL premix        2 g 200 mL/hr over 30 Minutes Intravenous On call to O.R. 12/09/20 0140 12/09/20 0818      .  Recent vital signs:  Vitals:   12/10/20 0421 12/10/20 1623  BP: 112/64 (!) 112/47  Pulse: 78 77  Resp: 20 16  Temp: 98.2 F (36.8 C) (!) 97.4 F (36.3 C)  SpO2: 90% 91%    Recent laboratory studies:  No results for input(s): WBC, HGB, HCT, PLT, K, CL, CO2, BUN, CREATININE, GLUCOSE, CALCIUM, LABPT, INR in the last 72 hours.  Diagnostic Studies: DG Hip Port Unilat With Pelvis 1V Right  Result Date: 12/09/2020 CLINICAL DATA:  Status post right hip arthroplasty EXAM: AP and cross-table lateral views of the right hip COMPARISON:  None. 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 Surgical drain in place. Multiple surgical about the right hip/femur IMPRESSION: Status post total right hip arthroplasty with postsurgical changes as expected Electronically Signed   By: Larose Hires D.O.   On: 12/09/2020 12:35    Discharge Medications:   Allergies as of 12/10/2020       Reactions   Lactose Intolerance (gi) Diarrhea   Lisinopril Cough        Medication List     STOP taking these medications    aspirin EC 81 MG tablet   meloxicam 15 MG tablet Commonly known as: MOBIC       TAKE these medications    acetaminophen 500 MG tablet Commonly known as: TYLENOL Take 1,000-2,000 mg by mouth 2 (two) times daily. 2 tabs in AM and 4 tabs in PM   amLODipine 5 MG tablet Commonly known as: NORVASC Take 5 mg by mouth every morning.   celecoxib 200 MG capsule Commonly known as: CeleBREX Take 1 capsule (200 mg total) by mouth 2 (two) times daily.   enoxaparin 40 MG/0.4ML injection Commonly known as: LOVENOX Inject 0.4 mLs (40 mg total) into the skin daily for 14 days.   fluticasone 50 MCG/ACT nasal spray Commonly known as: FLONASE Place 1 spray into both nostrils as needed for allergies or rhinitis.   oxyCODONE 5 MG immediate release tablet Commonly known as: Roxicodone Take 1 tablet (5 mg total) by mouth every 4 (four) hours as needed for severe  pain.   traMADol 50 MG tablet Commonly known as: ULTRAM Take 1 tablet (50 mg total) by mouth every 4 (four) hours as needed for moderate pain.   TURMERIC PO Take 2 tablets by mouth daily.               Durable Medical Equipment  (From admission, onward)           Start     Ordered   12/09/20 1203  DME Walker rolling  Once       Question:  Patient needs a walker to treat with the following condition  Answer:  S/P total hip arthroplasty   12/09/20 1202   12/09/20 1203  DME Bedside commode  Once       Question:  Patient needs a bedside commode to treat with the following condition  Answer:  S/P total hip arthroplasty   12/09/20 1202            Disposition: Home with home health PT     Follow-up Information  Donato Heinz, MD Follow up on 01/28/2021.   Specialty: Orthopedic Surgery Why: at 2     :30pm Contact information: [address] [facility] [place] [address] [phone]                  Lasandra Beech, PA-C 12/10/2020, 4:24 PM

## 2020-12-11 ENCOUNTER — Encounter: Payer: Self-pay | Admitting: Orthopedic Surgery

## 2020-12-11 LAB — SURGICAL PATHOLOGY

## 2021-04-12 NOTE — H&P (Signed)
ORTHOPAEDIC HISTORY & PHYSICAL Catherine Hale, Catherine Mings., MD - 04/09/2021 3:30 PM EST Formatting of this note is different from the original. Images from the original note were not included. Chief Complaint: Chief Complaint  Patient presents with   Wrist Pain  DISCUSS LEFT WRIST CARPAL TUNNEL SURGERY   Reason for Visit: The patient is a 81 y.o. right-hand dominant female who presents today for reevaluation of her left hand and wrist. She reports a long history of numbness and tingling involving the thumb, index, and long finger of the left hand. He does not recall any specific trauma or aggravating event. She occasionally gets a "shooting" type pain down the fingers. She has noticed some difficulty with gripping or grasping items. She denies any significant neck pain. Of note, the patient previously underwent right carpal tunnel release as per Dr. Cheri Fowler.  Medications: Current Outpatient Medications  Medication Sig Dispense Refill   acetaminophen (TYLENOL) 500 MG tablet Take 500 mg by mouth as needed for Pain   amLODIPine (NORVASC) 5 MG tablet TAKE 1 TABLET(5 MG) BY MOUTH EVERY DAY 90 tablet 3   aspirin 81 MG EC tablet Take 81 mg by mouth once daily.   fluticasone propionate (FLONASE) 50 mcg/actuation nasal spray SHAKE LIQUID AND USE 2 SPRAYS IN EACH NOSTRIL EVERY DAY 48 g 3   meloxicam (MOBIC) 15 MG tablet Take 1 tablet (15 mg total) by mouth once daily 90 tablet 3   TURMERIC ORAL Take 1 capsule by mouth once daily   No current facility-administered medications for this visit.   Allergies: Allergies  Allergen Reactions   Lactose Diarrhea   Lisinopril Cough   Past Medical History: Past Medical History:  Diagnosis Date   Bursitis   Chickenpox   Depression   Hyperlipidemia   Hypertension   Osteoporosis, post-menopausal  but doesn't remember her last bone density, she has actually been on medications for this in the past also.   Panic attacks  has been on Paxil in the past.    Pneumonia  back in the 70's   Past Surgical History: Past Surgical History:  Procedure Laterality Date   TONSILLECTOMY 1970   TUBAL LIGATION Bilateral 1976   COLONOSCOPY 12/2012   Right carpal tunnel release Right 03/01/2019  Dr. Rosita Kea   Right total hip arthroplasty 12/09/2020  Dr Ernest Pine   Social History: Social History   Socioeconomic History   Marital status: Widowed   Number of children: 2   Years of education: 15  Occupational History   Occupation: Part-time- Audiological scientist  Tobacco Use   Smoking status: Never   Smokeless tobacco: Never  Vaping Use   Vaping Use: Never used  Substance and Sexual Activity   Alcohol use: No  Alcohol/week: 0.0 standard drinks   Drug use: No   Sexual activity: Defer  Partners: Male   Family History: Family History  Problem Relation Age of Onset   Colon cancer Mother   Myocardial Infarction (Heart attack) Sister 67   Review of Systems: A comprehensive 14 point ROS was performed, reviewed, and the pertinent orthopaedic findings are documented in the HPI.  Exam BP 124/82 (BP Location: Left upper arm, Patient Position: Sitting, BP Cuff Size: Large Adult)   Ht 162.6 cm (5\' 4" )   Wt 88 kg (194 lb)   LMP (LMP Unknown)   BMI 33.30 kg/m   General:  Well-developed, well-nourished female seen in no acute distress.   HEENT:  Atraumatic, normocephalic. Pupils are equal and reactive to light. Extraocular  motion is intact. Sclera are clear. Oropharynx is clear with moist mucosa.  Neck: Good range of motion. No tenderness to palpation. Spurling`s test is negative.  Lungs:  Clear to auscultation bilaterally.  Cardiovascular:  Regular rate and rhythm. Normal S1, S2. No murmur . No appreciable gallops or rubs. Peripheral pulses are palpable.   Extremities:  Normal shoulder contour.  Good range of motion and stability of the shoulders, elbows, and wrists. Tinel`s test at the elbow is negative.  Left hand:  Tenderness:  Negative Erythema: negative Swelling: negative Capillary Refill: normal Thenar atrophy: negative Intrinsic wasting: negative Grip strength: fair to good grip strength Pincer strength: fair to good pincer strength Tinel`s test: positive Phalen`s test: positive Triggering: No gross triggering or locking of the digits Finkelstein`s test: negative Range of motion: The patient has some difficulty with making a clenched fist due to some stiffness of the digits.  Neurologic:  Awake, alert , and oriented.  Sensory function is intact except for decreased discrimination to pinprick and light touch in a median nerve distribution. Motor strength is judged to be 5/5 except as noted above. No clonus or tremor.  Motor coordination is within normal limits.  Nerve conduction studies: EMG/nerve conduction studies performed by Dr. Cristopher Peru on 02/01/2019 were reviewed. 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 Findings are consistent with moderate (grade 3) degree of involvement on the left. There is no evidence of focal peripheral neuropathy involving either ulnar nerve.   Impression: Left carpal tunnel syndrome  Plan:  The findings were discussed in detail with the patient. The patient was given informational material on carpal tunnel release. Conservative treatment options were reviewed with the patient. We discussed the risks and benefits of surgical intervention. The usual perioperative course was also discussed in detail. The patient expressed understanding of the risks and benefits of surgical intervention and would like to proceed with plans for left carpal tunnel release.  I spent a total of 40 minutes in both face-to-face and non-face-to-face activities, excluding procedures performed, for this visit on the date of this encounter.   MEDICAL CLEARANCE: Per anesthesiology ACTIVITIES: As tolerated. WORK STATUS: Not  applicable. THERAPY: None MEDICATIONS: Requested Prescriptions   No prescriptions requested or ordered in this encounter   FOLLOW-UP: Return for postoperative follow-up.  Catherine Maclachlan P. Angie Fava., M.D.  Electronically signed by Shari Heritage., MD at 04/09/2021 4:50 PM EST

## 2021-04-14 ENCOUNTER — Other Ambulatory Visit: Payer: Self-pay

## 2021-04-14 ENCOUNTER — Encounter
Admission: RE | Admit: 2021-04-14 | Discharge: 2021-04-14 | Disposition: A | Discharge status: Home / Self Care | Payer: Medicare HMO | Source: Ambulatory Visit | Attending: Orthopedic Surgery | Admitting: Orthopedic Surgery

## 2021-04-14 NOTE — Patient Instructions (Addendum)
Your procedure is scheduled on: Wednesday, March 1 Report to the Registration Desk on the 1st floor of the [facility] To find out your arrival time, please call [phone] between 1PM - 3PM on: Tuesday, February 28  REMEMBER: Instructions that are not followed completely may result in serious medical risk, up to and including death; or upon the discretion of your surgeon and anesthesiologist your surgery may need to be rescheduled.  Do not eat food after midnight the night before surgery.  No gum chewing, lozengers or hard candies.  You may however, drink CLEAR liquids up to 2 hours before you are scheduled to arrive for your surgery. Do not drink anything within 2 hours of your scheduled arrival time.  Clear liquids include: - water  - apple juice without pulp - gatorade (not RED colors) - black coffee or tea (Do NOT add milk or creamers to the coffee or tea) Do NOT drink anything that is not on this list.  In addition, your doctor has ordered for you to drink the provided  Ensure Pre-Surgery Clear Carbohydrate Drink  Drinking this carbohydrate drink up to two hours before surgery helps to reduce insulin resistance and improve patient outcomes. Please complete drinking 2 hours prior to scheduled arrival time.  TAKE THESE MEDICATIONS THE MORNING OF SURGERY WITH A SIP OF WATER:  Amlodipine  One week prior to surgery: Stop Anti-inflammatories (NSAIDS) such as Advil, Aleve, Ibuprofen, Motrin, Naproxen, Naprosyn and Aspirin based products such as Excedrin, Goodys Powder, BC Powder. Stop ANY OVER THE COUNTER supplements until after surgery. You may however, continue to take Tylenol if needed for pain up until the day of surgery.  No Alcohol for 24 hours before or after surgery.  No Smoking including e-cigarettes for 24 hours prior to surgery.   On the morning of surgery brush your teeth with toothpaste and water, you may rinse your mouth with mouthwash if you wish. Do not  swallow any toothpaste or mouthwash.  Use CHG Soap as directed on instruction sheet.  Do not wear jewelry, make-up, hairpins, clips or nail polish.  Do not wear lotions, powders, or perfumes.   Do not shave body from the neck down 48 hours prior to surgery just in case you cut yourself which could leave a site for infection.  Also, freshly shaved skin may become irritated if using the CHG soap.  Contact lenses, hearing aids and dentures may not be worn into surgery.  Do not bring valuables to the hospital. [facility] is not responsible for any missing/lost belongings or valuables.   Notify your doctor if there is any change in your medical condition (cold, fever, infection).  Wear comfortable clothing (specific to your surgery type) to the hospital.  After surgery, you can help prevent lung complications by doing breathing exercises.  Take deep breaths and cough every 1-2 hours. Your doctor may order a device called an Incentive Spirometer to help you take deep breaths.  If you are being discharged the day of surgery, you will not be allowed to drive home. You will need a responsible adult (18 years or older) to drive you home and stay with you that night.   If you are taking public transportation, you will need to have a responsible adult (18 years or older) with you. Please confirm with your physician that it is acceptable to use public transportation.   Please call the Pre-admissions Testing Dept. at [phone] if you have any questions about these instructions.  Surgery Visitation Policy:  Patients undergoing a surgery or procedure may have one family member or support person with them as long as that person is not COVID-19 positive or experiencing its symptoms.  That person may remain in the waiting area during the procedure and may rotate out with other people.

## 2021-04-15 NOTE — Anesthesia Preprocedure Evaluation (Addendum)
Anesthesia Evaluation  Patient identified by MRN, date of birth, ID band Patient awake    Reviewed: Allergy & Precautions, NPO status , Patient's Chart, lab work & pertinent test results  Airway Mallampati: III  TM Distance: >3 FB Neck ROM: full    Dental  (+) Edentulous Upper, Edentulous Lower   Pulmonary shortness of breath and with exertion,    Pulmonary exam normal        Cardiovascular Exercise Tolerance: Poor METS: 3 - Mets hypertension, Pt. on medications Normal cardiovascular exam     Neuro/Psych PSYCHIATRIC DISORDERS Anxiety negative neurological ROS     GI/Hepatic negative GI ROS, Neg liver ROS,   Endo/Other  negative endocrine ROS  Renal/GU      Musculoskeletal  (+) Arthritis ,   Abdominal (+) + obese,   Peds  Hematology negative hematology ROS (+)   Anesthesia Other Findings Past Medical History: No date: Arthritis 2014: Breast mass     Comment:  report requested 6 mo f/u cysts rt, pt did not return No date: Bronchitis     Comment:  chronic No date: Complication of anesthesia     Comment:  hard to wake up No date: Hyperlipidemia No date: Hypertension No date: Osteoporosis No date: Panic attacks No date: Pneumonia     Comment:  h/o  Past Surgical History: 2020: CARPAL TUNNEL RELEASE; Right No date: COLONOSCOPY No date: TONSILLECTOMY No date: TUBAL LIGATION     Reproductive/Obstetrics negative OB ROS                             Anesthesia Physical  Anesthesia Plan  ASA: 2  Anesthesia Plan: General   Post-op Pain Management: Tylenol PO (pre-op)*, Celebrex PO (pre-op)* and Regional block*   Induction: Intravenous  PONV Risk Score and Plan: 2 and Ondansetron and Dexamethasone  Airway Management Planned: LMA  Additional Equipment:   Intra-op Plan:   Post-operative Plan:   Informed Consent: I have reviewed the patients History and Physical, chart,  labs and discussed the procedure including the risks, benefits and alternatives for the proposed anesthesia with the patient or authorized representative who has indicated his/her understanding and acceptance.     Dental advisory given  Plan Discussed with: CRNA, Anesthesiologist and Surgeon  Anesthesia Plan Comments:        Anesthesia Quick Evaluation

## 2021-04-16 ENCOUNTER — Ambulatory Visit
Admission: RE | Admit: 2021-04-16 | Discharge: 2021-04-16 | Disposition: A | Discharge status: Home / Self Care | Payer: Medicare HMO | Attending: Orthopedic Surgery | Admitting: Orthopedic Surgery

## 2021-04-16 ENCOUNTER — Ambulatory Visit: Payer: Medicare HMO | Admitting: Anesthesiology

## 2021-04-16 ENCOUNTER — Encounter
Admission: RE | Disposition: A | Discharge status: Home / Self Care | Payer: Self-pay | Source: Home / Self Care | Attending: Orthopedic Surgery

## 2021-04-16 ENCOUNTER — Encounter: Payer: Self-pay | Admitting: Orthopedic Surgery

## 2021-04-16 ENCOUNTER — Other Ambulatory Visit: Payer: Self-pay

## 2021-04-16 DIAGNOSIS — Z79899 Other long term (current) drug therapy: Secondary | ICD-10-CM | POA: Diagnosis not present

## 2021-04-16 DIAGNOSIS — E785 Hyperlipidemia, unspecified: Secondary | ICD-10-CM | POA: Insufficient documentation

## 2021-04-16 DIAGNOSIS — I10 Essential (primary) hypertension: Secondary | ICD-10-CM | POA: Insufficient documentation

## 2021-04-16 DIAGNOSIS — G5602 Carpal tunnel syndrome, left upper limb: Secondary | ICD-10-CM | POA: Insufficient documentation

## 2021-04-16 DIAGNOSIS — Z7982 Long term (current) use of aspirin: Secondary | ICD-10-CM | POA: Diagnosis not present

## 2021-04-16 DIAGNOSIS — Z9889 Other specified postprocedural states: Secondary | ICD-10-CM

## 2021-04-16 HISTORY — PX: CARPAL TUNNEL RELEASE: SHX101

## 2021-04-16 SURGERY — CARPAL TUNNEL RELEASE
Anesthesia: General | Site: Wrist | Laterality: Left

## 2021-04-16 MED ORDER — PROPOFOL 10 MG/ML IV BOLUS
INTRAVENOUS | Status: AC
  Filled 2021-04-16: qty 20

## 2021-04-16 MED ORDER — ACETAMINOPHEN 500 MG PO TABS: 1000.0000 mg | ORAL_TABLET | Freq: Once | ORAL | Status: AC

## 2021-04-16 MED ORDER — FAMOTIDINE 20 MG PO TABS: 20.0000 mg | ORAL_TABLET | Freq: Once | ORAL | Status: AC

## 2021-04-16 MED ORDER — METOCLOPRAMIDE HCL 5 MG/ML IJ SOLN: 5.0000 mg | Freq: Three times a day (TID) | INTRAMUSCULAR | Status: DC | PRN

## 2021-04-16 MED ORDER — CHLORHEXIDINE GLUCONATE 0.12 % MT SOLN
OROMUCOSAL | Status: AC
  Filled 2021-04-16: qty 15

## 2021-04-16 MED ORDER — CEFAZOLIN SODIUM-DEXTROSE 2-4 GM/100ML-% IV SOLN
INTRAVENOUS | Status: AC
  Filled 2021-04-16: qty 100

## 2021-04-16 MED ORDER — CEFAZOLIN SODIUM-DEXTROSE 2-4 GM/100ML-% IV SOLN
2.0000 g | INTRAVENOUS | Status: AC
  Administered 2021-04-16: 2 g via INTRAVENOUS

## 2021-04-16 MED ORDER — LIDOCAINE HCL (PF) 2 % IJ SOLN
INTRAMUSCULAR | Status: AC
  Filled 2021-04-16: qty 5

## 2021-04-16 MED ORDER — DEXAMETHASONE SODIUM PHOSPHATE 10 MG/ML IJ SOLN
INTRAMUSCULAR | Status: AC
  Filled 2021-04-16: qty 1

## 2021-04-16 MED ORDER — OXYCODONE HCL 5 MG PO TABS: 5.0000 mg | ORAL_TABLET | Freq: Once | ORAL | Status: DC | PRN

## 2021-04-16 MED ORDER — ORAL CARE MOUTH RINSE: 15.0000 mL | Freq: Once | OROMUCOSAL | Status: AC

## 2021-04-16 MED ORDER — FENTANYL CITRATE (PF) 100 MCG/2ML IJ SOLN
INTRAMUSCULAR | Status: DC | PRN
Start: 2021-04-16 — End: 2021-04-16
  Administered 2021-04-16: 25 ug via INTRAVENOUS

## 2021-04-16 MED ORDER — ONDANSETRON HCL 4 MG/2ML IJ SOLN
INTRAMUSCULAR | Status: AC
  Filled 2021-04-16: qty 2

## 2021-04-16 MED ORDER — BUPIVACAINE HCL (PF) 0.25 % IJ SOLN
INTRAMUSCULAR | Status: DC | PRN
  Administered 2021-04-16: 10 mL

## 2021-04-16 MED ORDER — FENTANYL CITRATE (PF) 100 MCG/2ML IJ SOLN
INTRAMUSCULAR | Status: AC
  Filled 2021-04-16: qty 2

## 2021-04-16 MED ORDER — PROPOFOL 10 MG/ML IV BOLUS
INTRAVENOUS | Status: DC | PRN
  Administered 2021-04-16: 20 mg via INTRAVENOUS
  Administered 2021-04-16: 150 mg via INTRAVENOUS

## 2021-04-16 MED ORDER — CELECOXIB 200 MG PO CAPS: 400.0000 mg | ORAL_CAPSULE | Freq: Once | ORAL | Status: AC

## 2021-04-16 MED ORDER — DEXAMETHASONE SODIUM PHOSPHATE 10 MG/ML IJ SOLN
INTRAMUSCULAR | Status: DC | PRN
Start: 2021-04-16 — End: 2021-04-16
  Administered 2021-04-16: 5 mg via INTRAVENOUS

## 2021-04-16 MED ORDER — HYDROCODONE-ACETAMINOPHEN 5-325 MG PO TABS: 1.0000 | ORAL_TABLET | ORAL | 0 refills | Status: AC | PRN

## 2021-04-16 MED ORDER — CHLORHEXIDINE GLUCONATE 0.12 % MT SOLN
15.0000 mL | Freq: Once | OROMUCOSAL | Status: AC
  Administered 2021-04-16: 15 mL via OROMUCOSAL

## 2021-04-16 MED ORDER — ONDANSETRON HCL 4 MG/2ML IJ SOLN
INTRAMUSCULAR | Status: DC | PRN
  Administered 2021-04-16: 4 mg via INTRAVENOUS

## 2021-04-16 MED ORDER — ACETAMINOPHEN 10 MG/ML IV SOLN: 1000.0000 mg | Freq: Once | INTRAVENOUS | Status: DC | PRN

## 2021-04-16 MED ORDER — FAMOTIDINE 20 MG PO TABS
ORAL_TABLET | ORAL | Status: AC
  Administered 2021-04-16: 20 mg via ORAL
  Filled 2021-04-16: qty 1

## 2021-04-16 MED ORDER — DROPERIDOL 2.5 MG/ML IJ SOLN
0.6250 mg | Freq: Once | INTRAMUSCULAR | Status: DC | PRN
  Filled 2021-04-16: qty 2

## 2021-04-16 MED ORDER — ACETAMINOPHEN 500 MG PO TABS
ORAL_TABLET | ORAL | Status: AC
  Administered 2021-04-16: 1000 mg via ORAL
  Filled 2021-04-16: qty 2

## 2021-04-16 MED ORDER — METOCLOPRAMIDE HCL 10 MG PO TABS: 5.0000 mg | ORAL_TABLET | Freq: Three times a day (TID) | ORAL | Status: DC | PRN

## 2021-04-16 MED ORDER — FENTANYL CITRATE (PF) 100 MCG/2ML IJ SOLN: 25.0000 ug | INTRAMUSCULAR | Status: DC | PRN

## 2021-04-16 MED ORDER — CELECOXIB 200 MG PO CAPS
ORAL_CAPSULE | ORAL | Status: AC
  Administered 2021-04-16: 400 mg via ORAL
  Filled 2021-04-16: qty 2

## 2021-04-16 MED ORDER — PHENYLEPHRINE 40 MCG/ML (10ML) SYRINGE FOR IV PUSH (FOR BLOOD PRESSURE SUPPORT)
PREFILLED_SYRINGE | INTRAVENOUS | Status: DC | PRN
Start: 2021-04-16 — End: 2021-04-16
  Administered 2021-04-16 (×3): 80 ug via INTRAVENOUS

## 2021-04-16 MED ORDER — LIDOCAINE HCL (CARDIAC) PF 100 MG/5ML IV SOSY
PREFILLED_SYRINGE | INTRAVENOUS | Status: DC | PRN
  Administered 2021-04-16: 100 mg via INTRAVENOUS

## 2021-04-16 MED ORDER — ONDANSETRON HCL 4 MG/2ML IJ SOLN: 4.0000 mg | Freq: Four times a day (QID) | INTRAMUSCULAR | Status: DC | PRN

## 2021-04-16 MED ORDER — OXYCODONE HCL 5 MG/5ML PO SOLN: 5.0000 mg | Freq: Once | ORAL | Status: DC | PRN

## 2021-04-16 MED ORDER — LACTATED RINGERS IV SOLN: INTRAVENOUS | Status: DC

## 2021-04-16 MED ORDER — ONDANSETRON HCL 4 MG PO TABS: 4.0000 mg | ORAL_TABLET | Freq: Four times a day (QID) | ORAL | Status: DC | PRN

## 2021-04-16 MED ORDER — SODIUM CHLORIDE 0.9 % IR SOLN: Status: DC | PRN

## 2021-04-16 SURGICAL SUPPLY — 27 items
BNDG ELASTIC 3X5.8 VLCR NS LF (GAUZE/BANDAGES/DRESSINGS) ×2 IMPLANT
BNDG ESMARK 4X12 TAN STRL LF (GAUZE/BANDAGES/DRESSINGS) ×2 IMPLANT
CAST PADDING 3X4FT ST 30246 (SOFTGOODS) ×1
CUFF TOURN SGL QUICK 18X4 (TOURNIQUET CUFF) ×2 IMPLANT
DRAPE U-SHAPE 47X51 STRL (DRAPES) ×2 IMPLANT
DRSG DERMACEA 8X12 NADH (GAUZE/BANDAGES/DRESSINGS) ×2 IMPLANT
DURAPREP 26ML APPLICATOR (WOUND CARE) ×2 IMPLANT
ELECT CAUTERY BLADE 6.4 (BLADE) ×2 IMPLANT
ELECT REM PT RETURN 9FT ADLT (ELECTROSURGICAL) ×2
ELECTRODE REM PT RTRN 9FT ADLT (ELECTROSURGICAL) ×1 IMPLANT
GAUZE SPONGE 4X4 12PLY STRL (GAUZE/BANDAGES/DRESSINGS) ×2 IMPLANT
GLOVE SURG ENC TEXT LTX SZ7.5 (GLOVE) ×2 IMPLANT
GLOVE SURG UNDER LTX SZ8 (GLOVE) ×2 IMPLANT
GOWN STRL REUS W/ TWL LRG LVL3 (GOWN DISPOSABLE) ×2 IMPLANT
GOWN STRL REUS W/TWL LRG LVL3 (GOWN DISPOSABLE) ×2
KIT TURNOVER KIT A (KITS) ×2 IMPLANT
MANIFOLD NEPTUNE II (INSTRUMENTS) ×2 IMPLANT
NS IRRIG 500ML POUR BTL (IV SOLUTION) ×2 IMPLANT
PACK EXTREMITY ARMC (MISCELLANEOUS) ×2 IMPLANT
PAD CAST CTTN 3X4 STRL (SOFTGOODS) ×1 IMPLANT
SOL PREP PVP 2OZ (MISCELLANEOUS) ×2
SOLUTION PREP PVP 2OZ (MISCELLANEOUS) ×1 IMPLANT
SPLINT CAST 1 STEP 3X12 (MISCELLANEOUS) ×2 IMPLANT
STOCKINETTE 48X4 2 PLY STRL (GAUZE/BANDAGES/DRESSINGS) ×1 IMPLANT
STOCKINETTE STRL 4IN 9604848 (GAUZE/BANDAGES/DRESSINGS) ×2 IMPLANT
SUT ETHILON 5-0 FS-2 18 BLK (SUTURE) ×2 IMPLANT
WATER STERILE IRR 500ML POUR (IV SOLUTION) ×2 IMPLANT

## 2021-04-16 NOTE — Transfer of Care (Signed)
Immediate Anesthesia Transfer of Care Note ? ?Patient: [name] ? ?Procedure(s) Performed: Left carpal tunnel release (Left: Wrist) ? ?Patient Location: PACU ? ?Anesthesia Type:General ? ?Level of Consciousness: drowsy ? ?Airway & Oxygen Therapy: Patient Spontanous Breathing and Patient connected to face mask oxygen ? ?Post-op Assessment: Report given to RN and Post -op Vital signs reviewed and stable ? ?Post vital signs: Reviewed and stable ? ?Last Vitals:  ?Vitals Value Taken Time  ?BP 112/63 04/16/21 1530  ?Temp    ?Pulse 56 04/16/21 1530  ?Resp 10 04/16/21 1530  ?SpO2 100 % 04/16/21 1530  ?Vitals shown include unvalidated device data. ? ?Last Pain:  ?Vitals:  ? 04/16/21 1330  ?TempSrc: Tympanic  ?PainSc: 0-No pain  ?   ? ?  ? ?Complications: No notable events documented. ?

## 2021-04-16 NOTE — Discharge Instructions (Addendum)
Instructions after Hand / Wrist Surgery   James P. Hooten, Jr., M.D.  Dept. of Orthopaedics & Sports Medicine  [facility]  [address]  [place], [place]  [address]   Phone: [phone]   Fax: [phone]   DIET: Drink plenty of non-alcoholic fluids & begin a light diet. Resume your normal diet the day after surgery.  ACTIVITY:  Keep the hand elevated above the level of the elbow. Begin gently moving the fingers on a regular basis to avoid stiffness. Avoid any heavy lifting, pushing, or pulling with the operative hand. Do not drive or operate any equipment until instructed.  WOUND CARE:  Keep the splint/bandage clean and dry.  The splint and stitches will be removed in the office. Continue to use the ice packs periodically to reduce pain and swelling. You may bathe or shower after the stitches are removed at the first office visit following surgery.  MEDICATIONS: You may resume your regular medications. Please take the pain medication as prescribed. Do not take pain medication on an empty stomach. Do not drive or drink alcoholic beverages when taking pain medications.  CALL THE OFFICE FOR: Temperature above 101 degrees Excessive bleeding or drainage on the dressing. Excessive swelling, coldness, or paleness of the fingers. Persistent nausea and vomiting.  FOLLOW-UP:  You should have an appointment to return to the office in 7-10 days after surgery.   REMEMBER: R.I.C.E. = Rest, Ice, Compression, Elevation !     [facility] Department Directory         [email or web address]       [email or web address]          Cardiology  Appointments: [place] - [phone] [place] - [phone]  Endocrinology  Appointments: [place] - [phone] [place] - [phone]  Gastroenterology  Appointments: [place] - [phone] [place] - [phone]        General Surgery   Appointments: [place] -  [phone]  Internal Medicine/Family Medicine  Appointments: [place] - [phone] [place] - [phone] [place] - [phone]  Metabolic and Weigh Loss Surgery  Appointments: [place] - [phone]        Neurology  Appointments: [place] - [phone] [place] - [phone]  Neurosurgery  Appointments: [place] - [phone]  Obstetrics & Gynecology  Appointments: [place] - [phone] [place] - [phone]        Pediatrics  Appointments: [place] - [phone] [place] - [phone]  Physiatry  Appointments: [place] -[phone]  Physical Therapy  Appointments: [place] - [phone] [place] - [phone]        Podiatry  Appointments: [place] - [phone] [place] - [phone]  Pulmonology  Appointments: [place] - [phone]  Rheumatology  Appointments: [place] - [phone]        [place] Location: [facility]  [address] , [place]  [address]  [place] Location: [facility] [address] [place], [place]  [address]  [place] Location: [facility] [address] [place], [place]  [address]   AMBULATORY SURGERY  DISCHARGE INSTRUCTIONS   The drugs that you were given will stay in your system until tomorrow so for the next 24 hours you should not:  Drive an automobile Make any legal decisions Drink any alcoholic beverage   You may resume regular meals tomorrow.  Today it is better to start with liquids and gradually work up to solid foods.  You may eat anything you prefer, but it is better to start with liquids, then soup and crackers, and gradually work up to solid foods.   Please notify your doctor immediately if you have any   unusual bleeding, trouble breathing, redness and pain at the surgery site, drainage, fever, or pain not relieved by medication.    Your post-operative visit with Dr.                                       is: Date:                        Time:    Please call  to schedule your post-operative visit.  Additional Instructions:  

## 2021-04-16 NOTE — Op Note (Signed)
OPERATIVE NOTE ? ?DATE OF SURGERY:  04/16/2021 ? ?PATIENT NAME:  [name]   ?DOB: [birth year]  ?MRN: [id number] ? ?PRE-OPERATIVE DIAGNOSIS: Left carpal tunnel syndrome ? ?POST-OPERATIVE DIAGNOSIS:  Same ? ?PROCEDURE:  Left carpal tunnel release ? ?SURGEON:  Marciano Sequin. M.D. ? ?ANESTHESIA: general ? ?ESTIMATED BLOOD LOSS: Minimal ? ?FLUIDS REPLACED: 500 mL of crystalloid ? ?TOURNIQUET TIME: 27 minutes ? ?DRAINS: None ? ?INDICATIONS FOR SURGERY: [name] is a 81 y.o. year old female with a long history of numbness and paresthesias to the left hand. EMG/nerve conduction studies demonstrated findings consistent with carpal tunnel syndrome.The patient had not seen any significant improvement despite conservative nonsurgical intervention. After discussion of the risks and benefits of surgical intervention, the patient expressed understanding of the risks benefits and agree with plans for carpal tunnel release.  ? ?PROCEDURE IN DETAIL: The patient was brought into the operating room and after adequate general anesthesia, a tourniquet was placed on the patient's left upper arm.The left hand and arm were prepped with alcohol and Duraprep and draped in the usual sterile fashion. A "time-out" was performed as per usual protocol. The hand and forearm were exsanguinated using an Esmarch and the tourniquet was inflated to 250 mmHg. Loupe magnification was used throughout the procedure. An incision was made just ulnar to the thenar palmar crease. Dissection was carried down through the palmar fascia to the transverse carpal ligament. The transverse carpal ligament was sharply incised, taking care to protect the underlying structures with the carpal tunnel. Complete release of the transverse carpal ligament was achieved. There was no evidence of ganglion cyst or lipoma within the carpal tunnel. The wound was irrigated with copious amounts of normal saline with antibiotic solution. The skin was then  re-approximated with interrupted sutures of #5-0 nylon.  10 mL of 0.25% Marcaine was injected along the incision site.  A sterile dressing was applied followed by application of a volar splint. The tourniquet was deflated with a total tourniquet time of 27 minutes. ? ?The patient tolerated the procedure well and was transported to the PACU in stable condition. ? ?Ja Pistole P. Holley Bouche., M.D.  ?

## 2021-04-16 NOTE — Progress Notes (Signed)
Patient awake/alert x4. LUE carpal tunnel release, digits warm to touch able to wiggle. Pulses intact, afebrile ?

## 2021-04-16 NOTE — Anesthesia Procedure Notes (Signed)
Procedure Name: LMA Insertion ?Date/Time: 04/16/2021 2:37 PM ?Performed by: Loletha Grayer, CRNA ?Pre-anesthesia Checklist: Patient identified, Patient being monitored, Timeout performed, Emergency Drugs available and Suction available ?Patient Re-evaluated:Patient Re-evaluated prior to induction ?Oxygen Delivery Method: Circle system utilized ?Preoxygenation: Pre-oxygenation with 100% oxygen ?Induction Type: IV induction ?Ventilation: Mask ventilation without difficulty ?LMA: LMA inserted ?LMA Size: 4.0 ?Number of attempts: 1 ?Placement Confirmation: positive ETCO2 ?Tube secured with: Tape ?Dental Injury: Teeth and Oropharynx as per pre-operative assessment  ? ? ? ? ?

## 2021-04-16 NOTE — H&P (Signed)
The patient has been re-examined, and the chart reviewed, and there have been no interval changes to the documented history and physical.    The risks, benefits, and alternatives have been discussed at length. The patient expressed understanding of the risks benefits and agreed with plans for surgical intervention.  Asjah Rauda P. Keiyon Plack, Jr. M.D.    

## 2021-04-17 ENCOUNTER — Encounter: Payer: Self-pay | Admitting: Orthopedic Surgery

## 2021-04-18 NOTE — Anesthesia Postprocedure Evaluation (Signed)
Anesthesia Post Note ? ?Patient: Catherine Hale ? ?Procedure(s) Performed: Left carpal tunnel release (Left: Wrist) ? ?Patient location during evaluation: PACU ?Anesthesia Type: General ?Level of consciousness: awake and alert ?Pain management: pain level controlled ?Vital Signs Assessment: post-procedure vital signs reviewed and stable ?Respiratory status: spontaneous breathing, nonlabored ventilation and respiratory function stable ?Cardiovascular status: blood pressure returned to baseline and stable ?Postop Assessment: no apparent nausea or vomiting ?Anesthetic complications: no ? ? ?No notable events documented. ? ? ?Last Vitals:  ?Vitals:  ? 04/16/21 1556 04/16/21 1621  ?BP: (!) 141/75 (!) 156/64  ?Pulse: 62 (!) 56  ?Resp: 14 18  ?Temp: (!) 36.2 ?C (!) 36.2 ?C  ?SpO2: 95% 96%  ?  ?Last Pain:  ?Vitals:  ? 04/16/21 1621  ?TempSrc: Oral  ?PainSc:   ? ? ?  ?  ?  ?  ?  ?  ? ?[place] Ouch ? ? ? ? ?

## 2021-05-06 ENCOUNTER — Encounter: Payer: Self-pay | Admitting: Orthopedic Surgery

## 2023-06-01 IMAGING — DX DG HIP (WITH OR WITHOUT PELVIS) 1V PORT*R*
2 series · 2 of 2 positions shown · non-contrast
Comparison: None.

CLINICAL DATA: Status post right hip arthroplasty

EXAM:
AP and cross-table lateral views of the right hip

[pelvis ap]
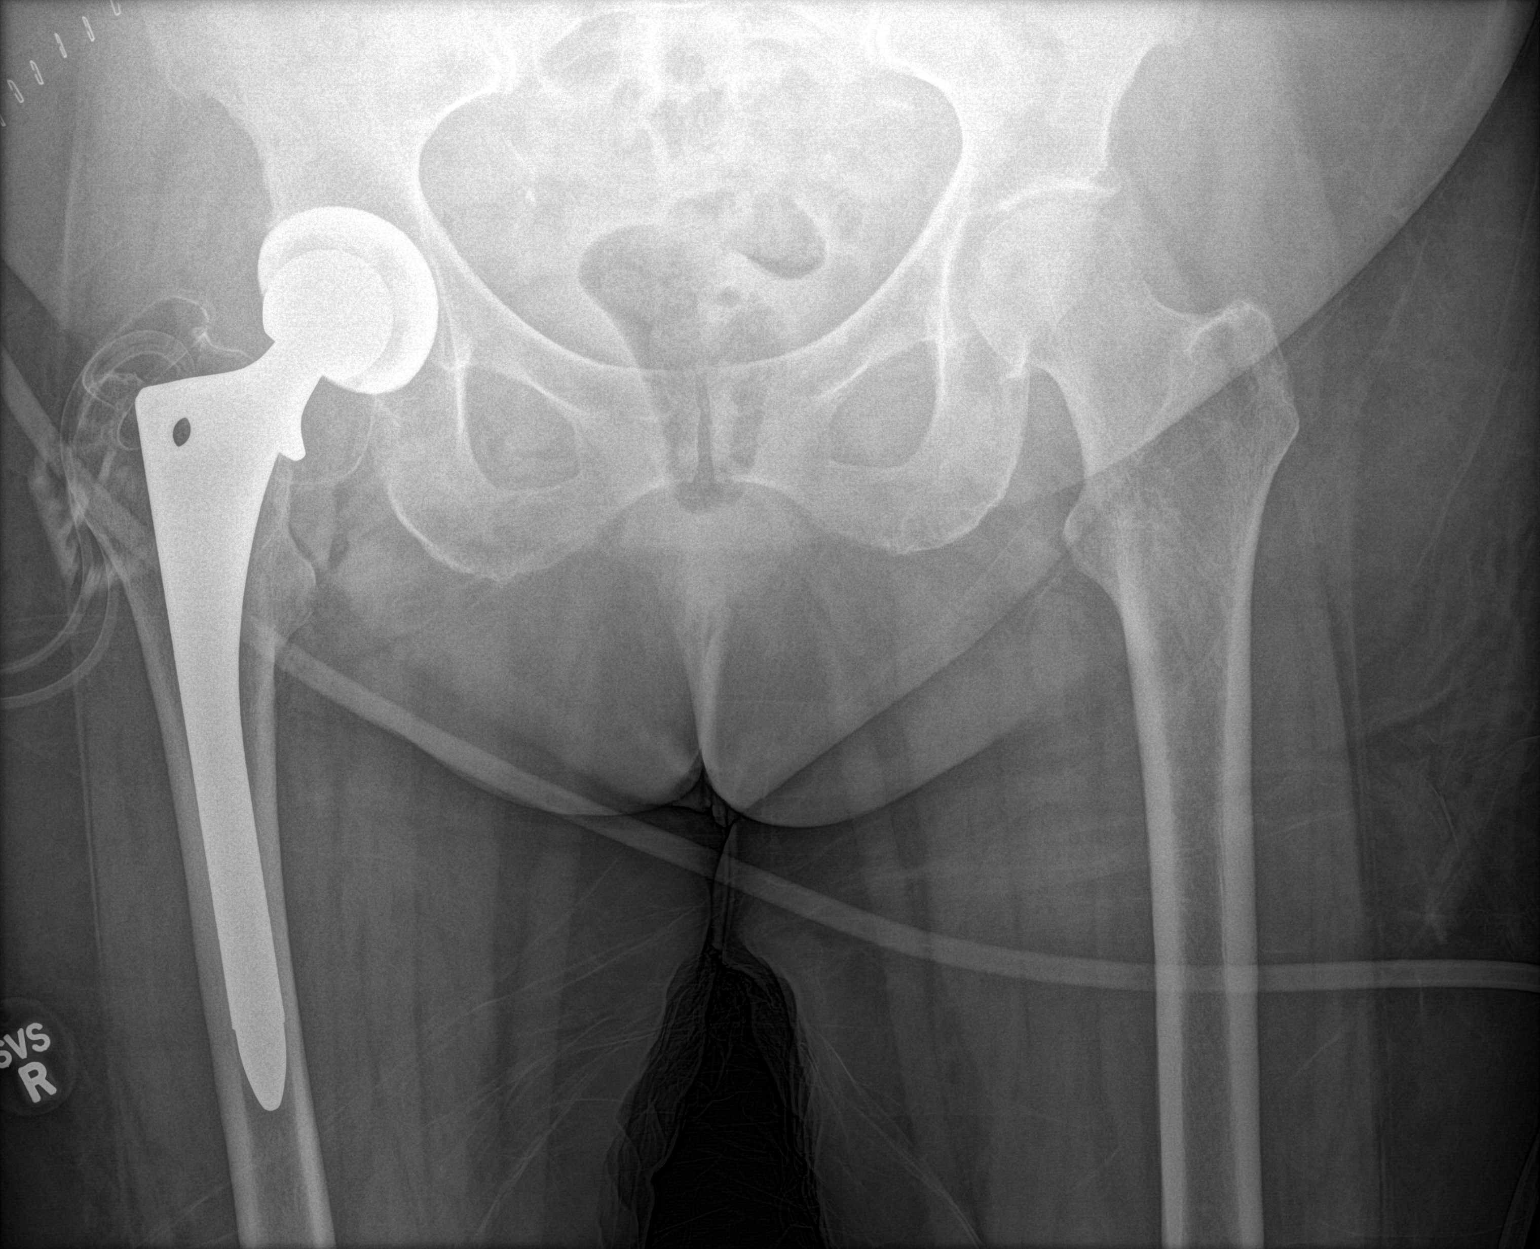

[hip lat]
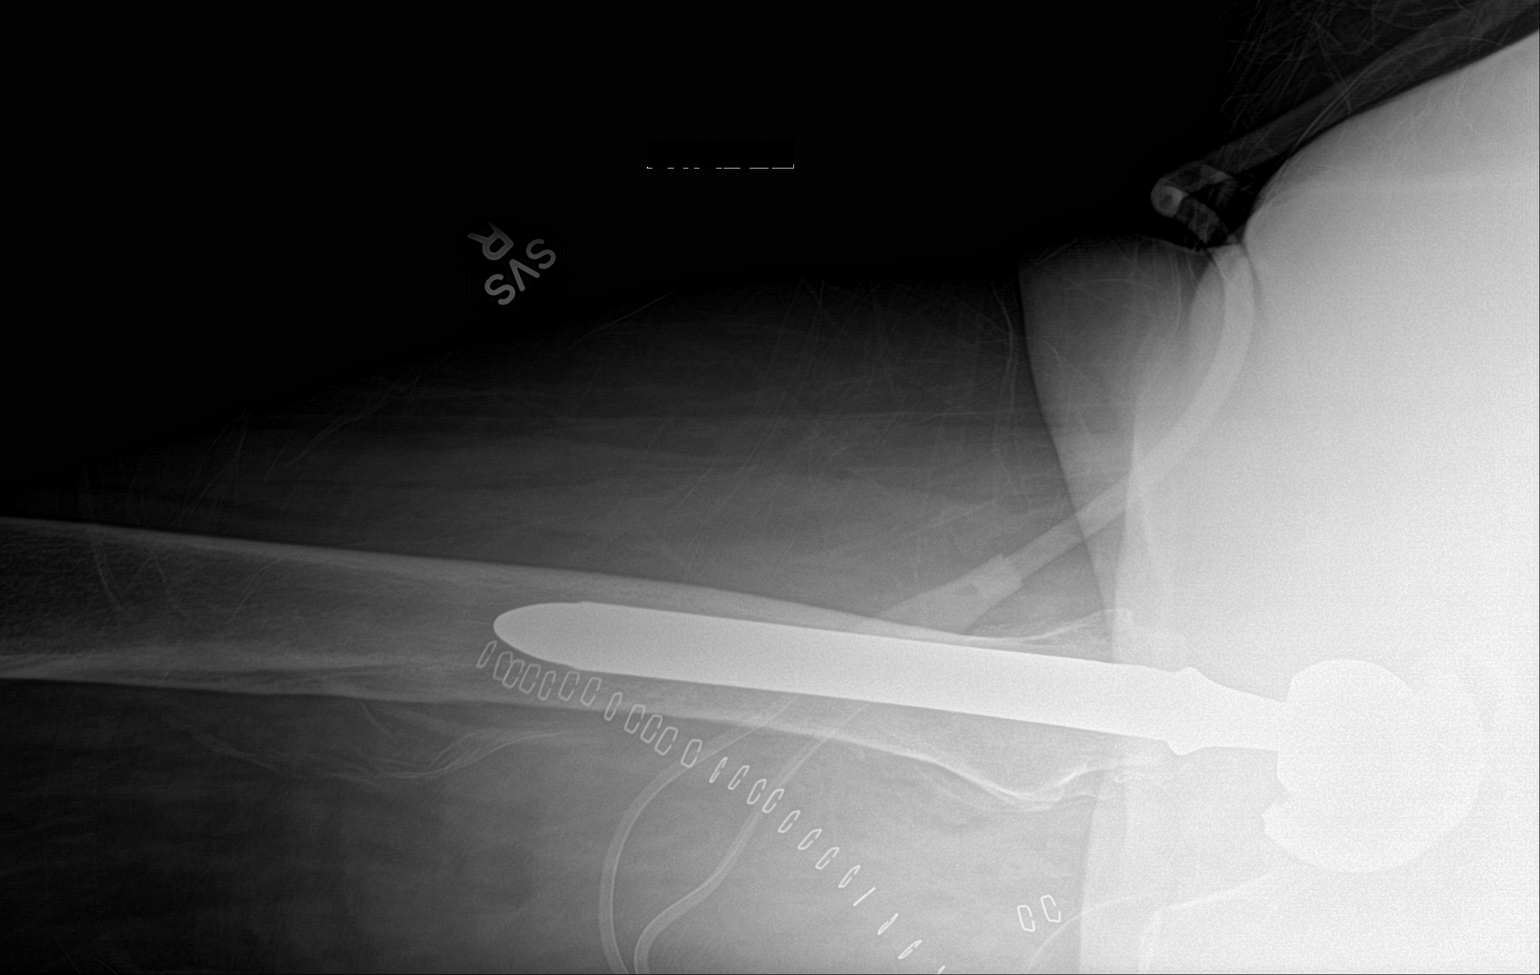

[2 of 2 positions shown; findings below may reference images not displayed]

FINDINGS: Status post total right hip arthroplasty with intact hardware.
Surgical drain in place. Multiple surgical about the right hip/femur
IMPRESSION: Status post total right hip arthroplasty with postsurgical changes
as expected

## 2023-09-13 ENCOUNTER — Other Ambulatory Visit: Payer: Self-pay | Admitting: Cardiology

## 2023-09-13 DIAGNOSIS — R0609 Other forms of dyspnea: Secondary | ICD-10-CM

## 2023-09-13 DIAGNOSIS — E66812 Obesity, class 2: Secondary | ICD-10-CM

## 2023-09-13 DIAGNOSIS — I10 Essential (primary) hypertension: Secondary | ICD-10-CM

## 2023-09-13 DIAGNOSIS — R0789 Other chest pain: Secondary | ICD-10-CM

## 2023-09-22 ENCOUNTER — Other Ambulatory Visit: Payer: Self-pay | Admitting: Physical Medicine & Rehabilitation

## 2023-09-22 ENCOUNTER — Encounter: Payer: Self-pay | Admitting: Physical Medicine & Rehabilitation

## 2023-09-22 DIAGNOSIS — G8929 Other chronic pain: Secondary | ICD-10-CM

## 2023-09-24 ENCOUNTER — Ambulatory Visit
Admission: RE | Admit: 2023-09-24 | Discharge: 2023-09-24 | Disposition: A | Discharge status: Home / Self Care | Source: Ambulatory Visit | Attending: Physical Medicine & Rehabilitation | Admitting: Physical Medicine & Rehabilitation

## 2023-09-24 DIAGNOSIS — G8929 Other chronic pain: Secondary | ICD-10-CM

## 2023-10-05 ENCOUNTER — Other Ambulatory Visit: Payer: Self-pay | Admitting: Cardiology

## 2023-10-05 ENCOUNTER — Encounter (HOSPITAL_COMMUNITY): Payer: Self-pay

## 2023-10-05 DIAGNOSIS — R0609 Other forms of dyspnea: Secondary | ICD-10-CM

## 2023-10-05 DIAGNOSIS — R0789 Other chest pain: Secondary | ICD-10-CM

## 2023-10-05 NOTE — Progress Notes (Signed)
 Orders only for Cardiac PET stress test.   Danita Bloch, PA-C

## 2023-10-07 ENCOUNTER — Ambulatory Visit
Admission: RE | Admit: 2023-10-07 | Discharge: 2023-10-07 | Disposition: A | Discharge status: Home / Self Care | Source: Ambulatory Visit | Attending: Cardiology | Admitting: Cardiology

## 2023-10-07 DIAGNOSIS — E66812 Obesity, class 2: Secondary | ICD-10-CM | POA: Insufficient documentation

## 2023-10-07 DIAGNOSIS — R0609 Other forms of dyspnea: Secondary | ICD-10-CM | POA: Diagnosis not present

## 2023-10-07 DIAGNOSIS — D3501 Benign neoplasm of right adrenal gland: Secondary | ICD-10-CM | POA: Insufficient documentation

## 2023-10-07 DIAGNOSIS — I10 Essential (primary) hypertension: Secondary | ICD-10-CM | POA: Insufficient documentation

## 2023-10-07 DIAGNOSIS — I700 Atherosclerosis of aorta: Secondary | ICD-10-CM | POA: Diagnosis not present

## 2023-10-07 DIAGNOSIS — R918 Other nonspecific abnormal finding of lung field: Secondary | ICD-10-CM | POA: Diagnosis not present

## 2023-10-07 DIAGNOSIS — R0789 Other chest pain: Secondary | ICD-10-CM | POA: Diagnosis not present

## 2023-10-07 LAB — NM PET CT CARDIAC PERFUSION MULTI W/ABSOLUTE BLOODFLOW
MBFR: 2.57
Nuc Rest EF: 51 %
Nuc Stress EF: 62 %
Peak HR: 82 {beats}/min
Rest HR: 65 {beats}/min
Rest MBF: 0.7 ml/g/min
Rest Nuclear Isotope Dose: 22.7 mCi
SDS: 1
SSS: 1
ST Depression (mm): 0 mm
Stress MBF: 1.8 ml/g/min
Stress Nuclear Isotope Dose: 22.8 mCi
TID: 1

## 2023-10-07 MED ORDER — RUBIDIUM RB82 GENERATOR (RUBYFILL)
25.0000 | PACK | Freq: Once | INTRAVENOUS | Status: AC
  Administered 2023-10-07: 22.76 via INTRAVENOUS

## 2023-10-07 MED ORDER — REGADENOSON 0.4 MG/5ML IV SOLN
0.4000 mg | Freq: Once | INTRAVENOUS | Status: AC
  Administered 2023-10-07: 0.4 mg via INTRAVENOUS
  Filled 2023-10-07: qty 5

## 2023-10-07 MED ORDER — REGADENOSON 0.4 MG/5ML IV SOLN
INTRAVENOUS | Status: AC
  Filled 2023-10-07: qty 5

## 2023-10-07 MED ORDER — RUBIDIUM RB82 GENERATOR (RUBYFILL)
25.0000 | PACK | Freq: Once | INTRAVENOUS | Status: AC
  Administered 2023-10-07: 22.7 via INTRAVENOUS

## 2023-10-07 NOTE — Progress Notes (Signed)
 Patient presents for a cardiac PET stress test and tolerated procedure without incident. Patient maintained acceptable vital signs throughout the test and was offered caffeine after test.  Patient escorted out of department in a wheelchair.
